# Patient Record
Sex: Male | Born: 1960
Health system: Southern US, Community
[De-identification: ages and names within clinical notes are randomized; demographics above are authoritative.]

## PROBLEM LIST (undated history)

## (undated) DIAGNOSIS — E7800 Pure hypercholesterolemia, unspecified: Secondary | ICD-10-CM

## (undated) DIAGNOSIS — F1410 Cocaine abuse, uncomplicated: Secondary | ICD-10-CM

## (undated) DIAGNOSIS — K219 Gastro-esophageal reflux disease without esophagitis: Secondary | ICD-10-CM

---

## 2018-08-22 ENCOUNTER — Emergency Department (HOSPITAL_BASED_OUTPATIENT_CLINIC_OR_DEPARTMENT_OTHER): Payer: BLUE CROSS/BLUE SHIELD

## 2018-08-22 ENCOUNTER — Encounter (HOSPITAL_BASED_OUTPATIENT_CLINIC_OR_DEPARTMENT_OTHER): Payer: Self-pay | Admitting: Emergency Medicine

## 2018-08-22 ENCOUNTER — Other Ambulatory Visit: Payer: Self-pay

## 2018-08-22 ENCOUNTER — Observation Stay (HOSPITAL_BASED_OUTPATIENT_CLINIC_OR_DEPARTMENT_OTHER)
Admission: EM | Admit: 2018-08-22 | Discharge: 2018-08-24 | Disposition: A | Discharge status: Home / Self Care | Payer: BLUE CROSS/BLUE SHIELD | Attending: Surgery | Admitting: Surgery

## 2018-08-22 DIAGNOSIS — K219 Gastro-esophageal reflux disease without esophagitis: Secondary | ICD-10-CM | POA: Insufficient documentation

## 2018-08-22 DIAGNOSIS — K8010 Calculus of gallbladder with chronic cholecystitis without obstruction: Principal | ICD-10-CM | POA: Insufficient documentation

## 2018-08-22 DIAGNOSIS — Z8379 Family history of other diseases of the digestive system: Secondary | ICD-10-CM | POA: Insufficient documentation

## 2018-08-22 DIAGNOSIS — F17210 Nicotine dependence, cigarettes, uncomplicated: Secondary | ICD-10-CM | POA: Insufficient documentation

## 2018-08-22 DIAGNOSIS — Z79899 Other long term (current) drug therapy: Secondary | ICD-10-CM | POA: Insufficient documentation

## 2018-08-22 DIAGNOSIS — K819 Cholecystitis, unspecified: Secondary | ICD-10-CM | POA: Diagnosis present

## 2018-08-22 DIAGNOSIS — E7800 Pure hypercholesterolemia, unspecified: Secondary | ICD-10-CM | POA: Insufficient documentation

## 2018-08-22 DIAGNOSIS — Z419 Encounter for procedure for purposes other than remedying health state, unspecified: Secondary | ICD-10-CM

## 2018-08-22 DIAGNOSIS — Z9049 Acquired absence of other specified parts of digestive tract: Secondary | ICD-10-CM

## 2018-08-22 DIAGNOSIS — K8000 Calculus of gallbladder with acute cholecystitis without obstruction: Secondary | ICD-10-CM | POA: Diagnosis present

## 2018-08-22 DIAGNOSIS — K810 Acute cholecystitis: Secondary | ICD-10-CM

## 2018-08-22 HISTORY — DX: Gastro-esophageal reflux disease without esophagitis: K21.9

## 2018-08-22 HISTORY — DX: Pure hypercholesterolemia, unspecified: E78.00

## 2018-08-22 LAB — COMPREHENSIVE METABOLIC PANEL
ALT: 28 U/L (ref 0–44)
ANION GAP: 9 (ref 5–15)
AST: 28 U/L (ref 15–41)
Albumin: 4.5 g/dL (ref 3.5–5.0)
Alkaline Phosphatase: 64 U/L (ref 38–126)
BUN: 9 mg/dL (ref 6–20)
CHLORIDE: 105 mmol/L (ref 98–111)
CO2: 28 mmol/L (ref 22–32)
CREATININE: 0.86 mg/dL (ref 0.61–1.24)
Calcium: 9.1 mg/dL (ref 8.9–10.3)
GLUCOSE: 119 mg/dL — AB (ref 70–99)
Potassium: 3.9 mmol/L (ref 3.5–5.1)
SODIUM: 142 mmol/L (ref 135–145)
TOTAL PROTEIN: 7.6 g/dL (ref 6.5–8.1)
Total Bilirubin: 0.5 mg/dL (ref 0.3–1.2)

## 2018-08-22 LAB — URINALYSIS, COMPLETE (UACMP) WITH MICROSCOPIC
BILIRUBIN URINE: NEGATIVE
GLUCOSE, UA: NEGATIVE mg/dL
HGB URINE DIPSTICK: NEGATIVE
Ketones, ur: NEGATIVE mg/dL
Leukocytes, UA: NEGATIVE
NITRITE: NEGATIVE
PH: 6.5 (ref 5.0–8.0)
Protein, ur: NEGATIVE mg/dL
SPECIFIC GRAVITY, URINE: 1.02 (ref 1.005–1.030)
WBC, UA: NONE SEEN WBC/hpf (ref 0–5)

## 2018-08-22 LAB — CBC WITH DIFFERENTIAL/PLATELET
BASOS PCT: 0 %
Basophils Absolute: 0 10*3/uL (ref 0.0–0.1)
EOS PCT: 5 %
Eosinophils Absolute: 0.7 10*3/uL (ref 0.0–0.7)
HCT: 46.8 % (ref 39.0–52.0)
Hemoglobin: 16.4 g/dL (ref 13.0–17.0)
LYMPHS ABS: 1.9 10*3/uL (ref 0.7–4.0)
Lymphocytes Relative: 15 %
MCH: 29.5 pg (ref 26.0–34.0)
MCHC: 35 g/dL (ref 30.0–36.0)
MCV: 84.2 fL (ref 78.0–100.0)
MONOS PCT: 9 %
Monocytes Absolute: 1.1 10*3/uL — ABNORMAL HIGH (ref 0.1–1.0)
NEUTROS PCT: 71 %
Neutro Abs: 8.9 10*3/uL — ABNORMAL HIGH (ref 1.7–7.7)
PLATELETS: 195 10*3/uL (ref 150–400)
RBC: 5.56 MIL/uL (ref 4.22–5.81)
RDW: 13.4 % (ref 11.5–15.5)
WBC: 12.5 10*3/uL — AB (ref 4.0–10.5)

## 2018-08-22 LAB — LIPASE, BLOOD: Lipase: 21 U/L (ref 11–51)

## 2018-08-22 LAB — TROPONIN I: Troponin I: <0.03 ng/mL (ref <0.03)

## 2018-08-22 MED ORDER — SODIUM CHLORIDE 0.9 % IV SOLN
INTRAVENOUS | Status: DC
  Administered 2018-08-22: 21:00:00 via INTRAVENOUS

## 2018-08-22 MED ORDER — MORPHINE SULFATE (PF) 4 MG/ML IV SOLN
4.0000 mg | Freq: Once | INTRAVENOUS | Status: AC
  Administered 2018-08-22: 4 mg via INTRAVENOUS
  Filled 2018-08-22: qty 1

## 2018-08-22 MED ORDER — ONDANSETRON HCL 4 MG/2ML IJ SOLN
4.0000 mg | Freq: Once | INTRAMUSCULAR | Status: AC
  Administered 2018-08-22: 4 mg via INTRAVENOUS
  Filled 2018-08-22: qty 2

## 2018-08-22 MED ORDER — SODIUM CHLORIDE 0.9 % IV BOLUS
1000.0000 mL | Freq: Once | INTRAVENOUS | Status: AC
  Administered 2018-08-22: 1000 mL via INTRAVENOUS

## 2018-08-22 MED ORDER — SODIUM CHLORIDE 0.9 % IV SOLN
2.0000 g | Freq: Once | INTRAVENOUS | Status: AC
  Administered 2018-08-22: 2 g via INTRAVENOUS
  Filled 2018-08-22: qty 20

## 2018-08-22 NOTE — ED Notes (Signed)
ED Provider at bedside. 

## 2018-08-22 NOTE — ED Triage Notes (Signed)
Epigastric pain since last night.  Pt has hx of heartburn but pain started last night after hot dog with chili.  Pain radiates to back.

## 2018-08-22 NOTE — ED Notes (Signed)
Pt back from US

## 2018-08-22 NOTE — ED Provider Notes (Signed)
[facility] EMERGENCY DEPARTMENT Provider Note   CSN: [id number] Arrival date & time: 08/22/18  1856     History   Chief Complaint Chief Complaint  Patient presents with  . Abdominal Pain  . Chest Pain    HPI Shawn Allen is a 57 y.o. male. Pt complains of 20 hours of pain in his upper abdomen.  It feels like a pressure in his upper abdomen that goes to his back.  It also burns.  The sx started after having a hot dog with chili.  SOme nause abut no vomiting.  No diarrhea or constpation.  No urinary trouble.  He denies chest pain or shortness of breath.  Some cough with smoking.   NO hx of heart problems.  No history of lung problems or dvt.  Pt has never been told he has history of htn.  Abdominal Pain    Chest Pain   Associated symptoms include abdominal pain.    Past Medical History:  Diagnosis Date  . GERD (gastroesophageal reflux disease)   . Hypercholesteremia     There are no active problems to display for this patient.   History reviewed. No pertinent surgical history.      Home Medications    Prior to Admission medications   Medication Sig Start Date End Date Taking? Authorizing Provider  ranitidine (ZANTAC) 150 MG capsule Take 300 mg by mouth 2 (two) times daily.     [provider]    Family History No family history on file.  Social History Social History   Tobacco Use  . Smoking status: Current Every Day Smoker  . Smokeless tobacco: Never Used  Substance Use Topics  . Alcohol use: Not on file  . Drug use: Not on file     Allergies   Patient has no known allergies.   Review of Systems Review of Systems  Cardiovascular: Positive for chest pain.  Gastrointestinal: Positive for abdominal pain.  All other systems reviewed and are negative.    Physical Exam Updated Vital Signs BP (!) 189/104 (BP Location: Left Arm)   Pulse (!) 54   Temp 98.5 F (36.9 C) (Oral)   Resp 18   Ht 1.778 m (5\' 10" )   Wt 83.9 kg    SpO2 98%   BMI 26.54 kg/m   Physical Exam  Constitutional: He appears well-developed and well-nourished. No distress.  HENT:  Head: Normocephalic and atraumatic.  Right Ear: External ear normal.  Left Ear: External ear normal.  Eyes: Conjunctivae are normal. Right eye exhibits no discharge. Left eye exhibits no discharge. No scleral icterus.  Neck: Neck supple. No tracheal deviation present.  Cardiovascular: Normal rate, regular rhythm and intact distal pulses.  Pulmonary/Chest: Effort normal and breath sounds normal. No stridor. No respiratory distress. He has no wheezes. He has no rales.  Abdominal: Soft. Bowel sounds are normal. He exhibits no distension. There is tenderness ( mild) in the epigastric area. There is no rebound and no guarding.  Musculoskeletal: He exhibits no edema or tenderness.  Neurological: He is alert. He has normal strength. No cranial nerve deficit (no facial droop, extraocular movements intact, no slurred speech) or sensory deficit. He exhibits normal muscle tone. He displays no seizure activity. Coordination normal.  Skin: Skin is warm and dry. No rash noted.  Psychiatric: He has a normal mood and affect.  Nursing note and vitals reviewed.    ED Treatments / Results  Labs (all labs ordered are listed, but only abnormal results  are displayed) Labs Reviewed  COMPREHENSIVE METABOLIC PANEL - Abnormal; Notable for the following components:      Result Value   Glucose, Bld 119 (*)    All other components within normal limits  CBC WITH DIFFERENTIAL/PLATELET - Abnormal; Notable for the following components:   WBC 12.5 (*)    Neutro Abs 8.9 (*)    Monocytes Absolute 1.1 (*)    All other components within normal limits  URINALYSIS, COMPLETE (UACMP) WITH MICROSCOPIC - Abnormal; Notable for the following components:   Bacteria, UA RARE (*)    All other components within normal limits  LIPASE, BLOOD  TROPONIN I    EKG EKG  Interpretation  Date/Time:  Tuesday August 22 2018 19:06:42 EDT Ventricular Rate:  56 PR Interval:  154 QRS Duration: 88 QT Interval:  402 QTC Calculation: 387 R Axis:   38 Text Interpretation:  Sinus bradycardia Otherwise normal ECG No old tracing to compare Confirmed by Linwood DibblesKnapp, Sareen Randon [phone]([id number]) on 08/22/2018 7:12:45 PM   Radiology Dg Chest 2 View  Result Date: 08/22/2018 CLINICAL DATA:  Lower chest pain last night and today. Nausea. Current smoker. EXAM: CHEST - 2 VIEW COMPARISON:  None. FINDINGS: The heart size and mediastinal contours are within normal limits. Both lungs are clear. The visualized skeletal structures are unremarkable. IMPRESSION: No active cardiopulmonary disease. Electronically Signed   By: Burman NievesWilliam  Stevens M.D.   On: 08/22/2018 21:03   [place] Abdomen Complete  Result Date: 08/22/2018 CLINICAL DATA:  Initial evaluation for acute epigastric and chest pain. EXAM: ABDOMEN ULTRASOUND COMPLETE COMPARISON:  None. FINDINGS: Gallbladder: Shadowing echogenic stones present within the gallbladder lumen, largest of which measures 1.6 cm. Associated sludge. Gallbladder wall mildly thickened up to 5 mm. No free pericholecystic fluid. Evaluation for sonographic Eulah PontMurphy sign limited as the patient was medicated. Note made of an 8 mm echogenic lesion seen adherent to the nondependent portion of the gallbladder wall (image 13). Common bile duct: Diameter: 6 mm Liver: No focal lesion identified. Diffusely increased echogenicity, suggesting steatosis. Portal vein is patent on color Doppler imaging with normal direction of blood flow towards the liver. IVC: No abnormality visualized. Pancreas: Visualized portion unremarkable. Spleen: Size and appearance within normal limits. Right Kidney: Length: 10.5 cm. Echogenicity within normal limits. No hydronephrosis. 3 mm nonobstructive stone noted. 1.5 cm simple cyst noted. Left Kidney: Length: 10.3 cm. Echogenicity within normal limits. No hydronephrosis. 4 mm  nonobstructive stone noted. Few small simple cysts measuring up to 1.1 cm noted. Abdominal aorta: Atherosclerotic change noted within the intra-abdominal aorta. The aorta itself is ectatic measuring up to 2.8 cm in diameter. Other findings: None. IMPRESSION: 1. Cholelithiasis with associated sludge and mild gallbladder wall thickening. Clinical correlation for possible acute cholecystitis recommended. 2. No biliary dilatation. 3. Hepatic steatosis. 4. Bilateral nonobstructive nephrolithiasis as above. No hydronephrosis. 5. Bilateral simple renal cysts measuring up to 1.5 cm as above. 6. 8 mm echogenic lesion arising from the gallbladder wall. Finding is indeterminate, with differential considerations including a small polyp versus adenoma. Possible malignancy not entirely excluded. A follow-up ultrasound in 1 year to document stability is recommended. 7. Ectatic abdominal aorta measuring up to 2.8 cm in diameter, at risk for aneurysm development. Recommend followup by ultrasound in 5 years. This recommendation follows ACR consensus guidelines: White Paper of the ACR Incidental Findings Committee II on Vascular Findings. J Am Coll Radiol 2013; 10:789-794. Electronically Signed   By: Rise MuBenjamin  McClintock M.D.   On: 08/22/2018 23:04    Procedures Procedures (  including critical care time)  Medications Ordered in ED Medications  sodium chloride 0.9 % bolus 1,000 mL ( Intravenous Stopped 08/22/18 2048)    And  0.9 %  sodium chloride infusion ( Intravenous Rate/Dose Verify 08/22/18 2051)  cefTRIAXone (ROCEPHIN) 2 g in sodium chloride 0.9 % 100 mL IVPB (has no administration in time range)  ondansetron (ZOFRAN) injection 4 mg (4 mg Intravenous Given 08/22/18 2037)  morphine 4 MG/ML injection 4 mg (4 mg Intravenous Given 08/22/18 2038)  morphine 4 MG/ML injection 4 mg (4 mg Intravenous Given 08/22/18 2128)     Initial Impression / Assessment and Plan / ED Course  I have reviewed the triage vital signs and the  nursing notes.  Pertinent labs & imaging results that were available during my care of the patient were reviewed by me and considered in my medical decision making (see chart for details).   Patient presented to the emergency room for evaluation of abdominal pain.  On exam the patient had tenderness palpation of his epigastrium and right upper quadrant.  Laboratory tests showed an elevated white blood cell count.  Ultrasound demonstrates cholelithiasis with some findings suggestive of acute cholecystitis.  Patient does continue to have tenderness in his upper abdomen.  Considering his elevated white blood cell count and the double cholecystitis noted on ultrasound I will consult with surgery and arrange for transfer and admission.  Final Clinical Impressions(s) / ED Diagnoses   Final diagnoses:  Cholecystitis, acute      Linwood Dibbles, MD 08/22/18 2323

## 2018-08-23 ENCOUNTER — Encounter (HOSPITAL_COMMUNITY)
Admission: EM | Disposition: A | Discharge status: Home / Self Care | Payer: Self-pay | Source: Home / Self Care | Attending: Emergency Medicine

## 2018-08-23 ENCOUNTER — Observation Stay (HOSPITAL_COMMUNITY): Payer: BLUE CROSS/BLUE SHIELD

## 2018-08-23 ENCOUNTER — Observation Stay (HOSPITAL_COMMUNITY): Payer: BLUE CROSS/BLUE SHIELD | Admitting: Certified Registered Nurse Anesthetist

## 2018-08-23 DIAGNOSIS — F17210 Nicotine dependence, cigarettes, uncomplicated: Secondary | ICD-10-CM | POA: Diagnosis not present

## 2018-08-23 DIAGNOSIS — K219 Gastro-esophageal reflux disease without esophagitis: Secondary | ICD-10-CM | POA: Diagnosis not present

## 2018-08-23 DIAGNOSIS — E7800 Pure hypercholesterolemia, unspecified: Secondary | ICD-10-CM | POA: Diagnosis not present

## 2018-08-23 DIAGNOSIS — Z8379 Family history of other diseases of the digestive system: Secondary | ICD-10-CM | POA: Diagnosis not present

## 2018-08-23 DIAGNOSIS — K8000 Calculus of gallbladder with acute cholecystitis without obstruction: Secondary | ICD-10-CM | POA: Diagnosis present

## 2018-08-23 DIAGNOSIS — K8010 Calculus of gallbladder with chronic cholecystitis without obstruction: Secondary | ICD-10-CM | POA: Diagnosis not present

## 2018-08-23 DIAGNOSIS — Z79899 Other long term (current) drug therapy: Secondary | ICD-10-CM | POA: Diagnosis not present

## 2018-08-23 DIAGNOSIS — Z9049 Acquired absence of other specified parts of digestive tract: Secondary | ICD-10-CM

## 2018-08-23 HISTORY — PX: CHOLECYSTECTOMY: SHX55

## 2018-08-23 LAB — CREATININE, SERUM
CREATININE: 0.94 mg/dL (ref 0.61–1.24)
GFR calc Af Amer: >60 mL/min (ref >60)
GFR calc non Af Amer: >60 mL/min (ref >60)

## 2018-08-23 LAB — CBC
HCT: 41.8 % (ref 39.0–52.0)
HEMOGLOBIN: 14.1 g/dL (ref 13.0–17.0)
MCH: 29.4 pg (ref 26.0–34.0)
MCHC: 33.7 g/dL (ref 30.0–36.0)
MCV: 87.3 fL (ref 78.0–100.0)
Platelets: 163 10*3/uL (ref 150–400)
RBC: 4.79 MIL/uL (ref 4.22–5.81)
RDW: 13.4 % (ref 11.5–15.5)
WBC: 10.1 10*3/uL (ref 4.0–10.5)

## 2018-08-23 LAB — MRSA PCR SCREENING: MRSA by PCR: NEGATIVE

## 2018-08-23 LAB — HIV ANTIBODY (ROUTINE TESTING W REFLEX): HIV Screen 4th Generation wRfx: NONREACTIVE

## 2018-08-23 SURGERY — LAPAROSCOPIC CHOLECYSTECTOMY WITH INTRAOPERATIVE CHOLANGIOGRAM
Anesthesia: General

## 2018-08-23 MED ORDER — HYDRALAZINE HCL 20 MG/ML IJ SOLN
10.0000 mg | Freq: Once | INTRAMUSCULAR | Status: AC
  Administered 2018-08-23: 10 mg via INTRAVENOUS

## 2018-08-23 MED ORDER — DIPHENHYDRAMINE HCL 12.5 MG/5ML PO ELIX: 12.5000 mg | ORAL_SOLUTION | Freq: Four times a day (QID) | ORAL | Status: DC | PRN

## 2018-08-23 MED ORDER — SIMETHICONE 80 MG PO CHEW: 40.0000 mg | CHEWABLE_TABLET | Freq: Four times a day (QID) | ORAL | Status: DC | PRN

## 2018-08-23 MED ORDER — FAMOTIDINE 20 MG PO TABS: 20.0000 mg | ORAL_TABLET | Freq: Every day | ORAL | Status: DC

## 2018-08-23 MED ORDER — FENTANYL CITRATE (PF) 100 MCG/2ML IJ SOLN
50.0000 ug | Freq: Once | INTRAMUSCULAR | Status: AC
  Administered 2018-08-23: 50 ug via INTRAVENOUS

## 2018-08-23 MED ORDER — ACETAMINOPHEN 650 MG RE SUPP: 650.0000 mg | Freq: Four times a day (QID) | RECTAL | Status: DC | PRN

## 2018-08-23 MED ORDER — ONDANSETRON 4 MG PO TBDP: 4.0000 mg | ORAL_TABLET | Freq: Four times a day (QID) | ORAL | Status: DC | PRN

## 2018-08-23 MED ORDER — MORPHINE SULFATE (PF) 2 MG/ML IV SOLN
1.0000 mg | INTRAVENOUS | Status: DC | PRN
  Administered 2018-08-23 (×2): 1 mg via INTRAVENOUS
  Filled 2018-08-23 (×2): qty 1

## 2018-08-23 MED ORDER — ENOXAPARIN SODIUM 40 MG/0.4ML ~~LOC~~ SOLN: 40.0000 mg | SUBCUTANEOUS | Status: DC

## 2018-08-23 MED ORDER — DIPHENHYDRAMINE HCL 50 MG/ML IJ SOLN: 12.5000 mg | Freq: Four times a day (QID) | INTRAMUSCULAR | Status: DC | PRN

## 2018-08-23 MED ORDER — HYDROMORPHONE HCL 1 MG/ML IJ SOLN
0.5000 mg | INTRAMUSCULAR | Status: DC | PRN
  Administered 2018-08-23 (×2): 1 mg via INTRAVENOUS
  Administered 2018-08-23: 2 mg via INTRAVENOUS
  Filled 2018-08-23 (×2): qty 1
  Filled 2018-08-23: qty 2

## 2018-08-23 MED ORDER — HYDRALAZINE HCL 20 MG/ML IJ SOLN: 10.0000 mg | INTRAMUSCULAR | Status: DC | PRN

## 2018-08-23 MED ORDER — KCL IN DEXTROSE-NACL 20-5-0.45 MEQ/L-%-% IV SOLN
INTRAVENOUS | Status: DC
  Administered 2018-08-23: 02:00:00 via INTRAVENOUS
  Filled 2018-08-23 (×2): qty 1000

## 2018-08-23 MED ORDER — ONDANSETRON HCL 4 MG/2ML IJ SOLN: 4.0000 mg | Freq: Four times a day (QID) | INTRAMUSCULAR | Status: DC | PRN

## 2018-08-23 MED ORDER — OXYCODONE HCL 5 MG PO TABS: 5.0000 mg | ORAL_TABLET | ORAL | Status: DC | PRN

## 2018-08-23 MED ORDER — IOPAMIDOL (ISOVUE-300) INJECTION 61%
INTRAVENOUS | Status: DC | PRN
  Administered 2018-08-23: 3 mL

## 2018-08-23 MED ORDER — LIDOCAINE 2% (20 MG/ML) 5 ML SYRINGE
INTRAMUSCULAR | Status: AC
  Filled 2018-08-23: qty 5

## 2018-08-23 MED ORDER — MIDAZOLAM HCL 5 MG/5ML IJ SOLN
INTRAMUSCULAR | Status: DC | PRN
  Administered 2018-08-23: 2 mg via INTRAVENOUS

## 2018-08-23 MED ORDER — ONDANSETRON HCL 4 MG/2ML IJ SOLN
INTRAMUSCULAR | Status: DC | PRN
  Administered 2018-08-23: 4 mg via INTRAVENOUS

## 2018-08-23 MED ORDER — SUCCINYLCHOLINE CHLORIDE 200 MG/10ML IV SOSY
PREFILLED_SYRINGE | INTRAVENOUS | Status: AC
  Filled 2018-08-23: qty 10

## 2018-08-23 MED ORDER — DEXAMETHASONE SODIUM PHOSPHATE 10 MG/ML IJ SOLN
INTRAMUSCULAR | Status: DC | PRN
  Administered 2018-08-23: 10 mg via INTRAVENOUS

## 2018-08-23 MED ORDER — HYDROMORPHONE HCL 2 MG/ML IJ SOLN
INTRAMUSCULAR | Status: AC
  Filled 2018-08-23: qty 1

## 2018-08-23 MED ORDER — ROCURONIUM BROMIDE 10 MG/ML (PF) SYRINGE
PREFILLED_SYRINGE | INTRAVENOUS | Status: AC
  Filled 2018-08-23: qty 10

## 2018-08-23 MED ORDER — MIDAZOLAM HCL 2 MG/2ML IJ SOLN
INTRAMUSCULAR | Status: AC
  Filled 2018-08-23: qty 2

## 2018-08-23 MED ORDER — LACTATED RINGERS IR SOLN
Status: DC | PRN
  Administered 2018-08-23: 1000 mL

## 2018-08-23 MED ORDER — FENTANYL CITRATE (PF) 100 MCG/2ML IJ SOLN
INTRAMUSCULAR | Status: AC
  Filled 2018-08-23: qty 2

## 2018-08-23 MED ORDER — TRAMADOL HCL 50 MG PO TABS: 50.0000 mg | ORAL_TABLET | Freq: Four times a day (QID) | ORAL | Status: DC | PRN

## 2018-08-23 MED ORDER — SODIUM CHLORIDE 0.9 % IV SOLN
2.0000 g | INTRAVENOUS | Status: DC
  Filled 2018-08-23: qty 20

## 2018-08-23 MED ORDER — ACETAMINOPHEN 325 MG PO TABS: 650.0000 mg | ORAL_TABLET | Freq: Four times a day (QID) | ORAL | Status: DC | PRN

## 2018-08-23 MED ORDER — HYDROMORPHONE HCL 1 MG/ML IJ SOLN
INTRAMUSCULAR | Status: DC | PRN
  Administered 2018-08-23 (×5): .4 mg via INTRAVENOUS

## 2018-08-23 MED ORDER — FAMOTIDINE IN NACL 20-0.9 MG/50ML-% IV SOLN
20.0000 mg | Freq: Two times a day (BID) | INTRAVENOUS | Status: DC
  Administered 2018-08-23 – 2018-08-24 (×2): 20 mg via INTRAVENOUS
  Filled 2018-08-23 (×2): qty 50

## 2018-08-23 MED ORDER — 0.9 % SODIUM CHLORIDE (POUR BTL) OPTIME
TOPICAL | Status: DC | PRN
  Administered 2018-08-23: 1000 mL

## 2018-08-23 MED ORDER — PROPOFOL 10 MG/ML IV BOLUS
INTRAVENOUS | Status: DC | PRN
  Administered 2018-08-23: 170 mg via INTRAVENOUS

## 2018-08-23 MED ORDER — HYDROMORPHONE HCL 1 MG/ML IJ SOLN: 0.2500 mg | INTRAMUSCULAR | Status: DC | PRN

## 2018-08-23 MED ORDER — IOPAMIDOL (ISOVUE-300) INJECTION 61%
INTRAVENOUS | Status: AC
  Filled 2018-08-23: qty 50

## 2018-08-23 MED ORDER — DEXAMETHASONE SODIUM PHOSPHATE 10 MG/ML IJ SOLN
INTRAMUSCULAR | Status: AC
  Filled 2018-08-23: qty 1

## 2018-08-23 MED ORDER — KCL IN DEXTROSE-NACL 20-5-0.45 MEQ/L-%-% IV SOLN
INTRAVENOUS | Status: DC
  Administered 2018-08-23 – 2018-08-24 (×2): via INTRAVENOUS
  Filled 2018-08-23: qty 1000

## 2018-08-23 MED ORDER — SUGAMMADEX SODIUM 200 MG/2ML IV SOLN
INTRAVENOUS | Status: DC | PRN
  Administered 2018-08-23: 200 mg via INTRAVENOUS

## 2018-08-23 MED ORDER — FENTANYL CITRATE (PF) 100 MCG/2ML IJ SOLN
INTRAMUSCULAR | Status: DC | PRN
  Administered 2018-08-23 (×6): 50 ug via INTRAVENOUS

## 2018-08-23 MED ORDER — SODIUM CHLORIDE 0.9 % IV SOLN
2.0000 g | INTRAVENOUS | Status: DC
  Administered 2018-08-24: 2 g via INTRAVENOUS
  Filled 2018-08-23: qty 2

## 2018-08-23 MED ORDER — GABAPENTIN 300 MG PO CAPS
300.0000 mg | ORAL_CAPSULE | Freq: Two times a day (BID) | ORAL | Status: DC
  Administered 2018-08-23 – 2018-08-24 (×2): 300 mg via ORAL
  Filled 2018-08-23 (×2): qty 1

## 2018-08-23 MED ORDER — LACTATED RINGERS IV SOLN
INTRAVENOUS | Status: DC
  Administered 2018-08-23 (×2): via INTRAVENOUS

## 2018-08-23 MED ORDER — SUCCINYLCHOLINE CHLORIDE 200 MG/10ML IV SOSY
PREFILLED_SYRINGE | INTRAVENOUS | Status: DC | PRN
  Administered 2018-08-23: 100 mg via INTRAVENOUS

## 2018-08-23 MED ORDER — MIDAZOLAM HCL 2 MG/2ML IJ SOLN
1.0000 mg | Freq: Once | INTRAMUSCULAR | Status: AC
  Administered 2018-08-23: 1 mg via INTRAVENOUS

## 2018-08-23 MED ORDER — PROPOFOL 10 MG/ML IV BOLUS
INTRAVENOUS | Status: AC
  Filled 2018-08-23: qty 20

## 2018-08-23 MED ORDER — METHOCARBAMOL 500 MG PO TABS: 500.0000 mg | ORAL_TABLET | Freq: Four times a day (QID) | ORAL | Status: DC | PRN

## 2018-08-23 MED ORDER — HEPARIN SODIUM (PORCINE) 5000 UNIT/ML IJ SOLN
5000.0000 [IU] | Freq: Three times a day (TID) | INTRAMUSCULAR | Status: DC
  Administered 2018-08-24: 5000 [IU] via SUBCUTANEOUS
  Filled 2018-08-23: qty 1

## 2018-08-23 MED ORDER — HYDRALAZINE HCL 20 MG/ML IJ SOLN
INTRAMUSCULAR | Status: AC
  Administered 2018-08-23: 10 mg via INTRAVENOUS
  Filled 2018-08-23: qty 1

## 2018-08-23 MED ORDER — ROCURONIUM BROMIDE 50 MG/5ML IV SOSY
PREFILLED_SYRINGE | INTRAVENOUS | Status: DC | PRN
  Administered 2018-08-23: 50 mg via INTRAVENOUS
  Administered 2018-08-23: 10 mg via INTRAVENOUS
  Administered 2018-08-23: 20 mg via INTRAVENOUS

## 2018-08-23 MED ORDER — HYDROCODONE-ACETAMINOPHEN 5-325 MG PO TABS
1.0000 | ORAL_TABLET | ORAL | Status: DC | PRN
  Administered 2018-08-24: 1 via ORAL
  Filled 2018-08-23: qty 1

## 2018-08-23 MED ORDER — LIDOCAINE 2% (20 MG/ML) 5 ML SYRINGE
INTRAMUSCULAR | Status: DC | PRN
  Administered 2018-08-23: 80 mg via INTRAVENOUS

## 2018-08-23 MED ORDER — BUPIVACAINE LIPOSOME 1.3 % IJ SUSP
20.0000 mL | Freq: Once | INTRAMUSCULAR | Status: AC
  Administered 2018-08-23: 20 mL
  Filled 2018-08-23: qty 20

## 2018-08-23 SURGICAL SUPPLY — 43 items
APPLICATOR COTTON TIP 6 STRL (MISCELLANEOUS) ×1 IMPLANT
APPLICATOR COTTON TIP 6IN STRL (MISCELLANEOUS) ×3
APPLIER CLIP ROT 10 11.4 M/L (STAPLE) ×3
BENZOIN TINCTURE PRP APPL 2/3 (GAUZE/BANDAGES/DRESSINGS) IMPLANT
CABLE HIGH FREQUENCY MONO STRZ (ELECTRODE) ×3 IMPLANT
CATH REDDICK CHOLANGI 4FR 50CM (CATHETERS) ×3 IMPLANT
CLIP APPLIE ROT 10 11.4 M/L (STAPLE) ×1 IMPLANT
CLOSURE WOUND 1/2 X4 (GAUZE/BANDAGES/DRESSINGS)
COVER MAYO STAND STRL (DRAPES) ×3 IMPLANT
COVER SURGICAL LIGHT HANDLE (MISCELLANEOUS) ×3 IMPLANT
DERMABOND ADVANCED (GAUZE/BANDAGES/DRESSINGS) ×2
DERMABOND ADVANCED .7 DNX12 (GAUZE/BANDAGES/DRESSINGS) ×1 IMPLANT
DRAPE C-ARM 42X120 X-RAY (DRAPES) ×3 IMPLANT
ELECT L-HOOK LAP 45CM DISP (ELECTROSURGICAL) ×3
ELECT PENCIL ROCKER SW 15FT (MISCELLANEOUS) IMPLANT
ELECT REM PT RETURN 15FT ADLT (MISCELLANEOUS) ×3 IMPLANT
ELECTRODE L-HOOK LAP 45CM DISP (ELECTROSURGICAL) ×1 IMPLANT
GLOVE BIOGEL M 8.0 STRL (GLOVE) ×3 IMPLANT
GLOVE BIOGEL PI IND STRL 6 (GLOVE) ×1 IMPLANT
GLOVE BIOGEL PI IND STRL 7.0 (GLOVE) ×2 IMPLANT
GLOVE BIOGEL PI INDICATOR 6 (GLOVE) ×2
GLOVE BIOGEL PI INDICATOR 7.0 (GLOVE) ×4
GLOVE SURG SS PI 6.0 STRL IVOR (GLOVE) ×3 IMPLANT
GLOVE SURG SS PI 7.0 STRL IVOR (GLOVE) ×6 IMPLANT
GOWN STRL REUS W/TWL XL LVL3 (GOWN DISPOSABLE) ×12 IMPLANT
HEMOSTAT SURGICEL 4X8 (HEMOSTASIS) IMPLANT
IV CATH 14GX2 1/4 (CATHETERS) ×3 IMPLANT
KIT BASIN OR (CUSTOM PROCEDURE TRAY) ×3 IMPLANT
POUCH RETRIEVAL ECOSAC 10 (ENDOMECHANICALS) ×1 IMPLANT
POUCH RETRIEVAL ECOSAC 10MM (ENDOMECHANICALS) ×2
SCISSORS LAP 5X45 EPIX DISP (ENDOMECHANICALS) ×3 IMPLANT
SET IRRIG TUBING LAPAROSCOPIC (IRRIGATION / IRRIGATOR) ×3 IMPLANT
SLEEVE XCEL OPT CAN 5 100 (ENDOMECHANICALS) ×3 IMPLANT
STAPLER VISISTAT 35W (STAPLE) ×3 IMPLANT
STRIP CLOSURE SKIN 1/2X4 (GAUZE/BANDAGES/DRESSINGS) IMPLANT
SUT MNCRL AB 4-0 PS2 18 (SUTURE) ×3 IMPLANT
SYR 20CC LL (SYRINGE) ×3 IMPLANT
TOWEL OR 17X26 10 PK STRL BLUE (TOWEL DISPOSABLE) ×3 IMPLANT
TRAY LAPAROSCOPIC (CUSTOM PROCEDURE TRAY) ×3 IMPLANT
TROCAR BLADELESS OPT 5 100 (ENDOMECHANICALS) ×3 IMPLANT
TROCAR XCEL BLUNT TIP 100MML (ENDOMECHANICALS) IMPLANT
TROCAR XCEL NON-BLD 11X100MML (ENDOMECHANICALS) ×3 IMPLANT
TUBING INSUF HEATED (TUBING) ×3 IMPLANT

## 2018-08-23 NOTE — Op Note (Signed)
Shawn KeelsJeffrey Allen  Primary Care Physician:  Patient, No Pcp Per    08/23/2018  5:02 PM  Procedure: Laparoscopic Cholecystectomy with intraoperative cholangiogram  Surgeon: Susy FrizzleMatt B. Daphine DeutscherMartin, MD, FACS Asst:  none  Anes:  General  Drains:  None  Findings: Acute cholecystitis with normal IOC  Description of Procedure: The patient was taken to OR 1 and given general anesthesia.  The patient was prepped with PCMX and draped sterilely. A time out was performed.  Access to the abdomen was achieved with a 5 mm Optiview through the right upper quadrant.  Port placement included three 5 mm ports and one 11 mm port in the midline.    The gallbladder was visualized and the fundus was grasped and the gallbladder was elevated. Traction on the infundibulum allowed for successful demonstration of the critical view. Inflammatory changes were acute and the gallbladder was decompressed with the suction needle.  Infundibulum was taken down from the adhesions from the duodenum.  The cystic duct was identified and clipped up on the gallbladder and an incision was made in the cystic duct and the Reddick catheter was inserted after milking the cystic duct of any debris. A dynamic cholangiogram was performed which demonstrated showed intrahepatic filling and flow into the duodenum.    The cystic duct was then triple clipped and divided, the cystic artery was double clipped and divided and then the gallbladder was removed from the gallbladder bed. Removal of the gallbladder from the gallbladder bed was performed without spillage.  The gallbladder was then placed in a bag and brought out through one of the trocar sites. The gallbladder bed was inspected and no bleeding or bile leaks were seen.   Laparoscopic visualization was used when closing fascial defect in the upper abdomen with a PRI.   Incisions were injected with Exparel and closed with 4-0 Monocryl and Dermabond on the skin.  Sponge and needle count were correct.  .     The patient was taken to the recovery room in satisfactory condition.

## 2018-08-23 NOTE — Anesthesia Postprocedure Evaluation (Signed)
Anesthesia Post Note  Patient: Shawn KeelsJeffrey Allen  Procedure(s) Performed: LAPAROSCOPIC CHOLECYSTECTOMY WITH INTRAOPERATIVE CHOLANGIOGRAM (N/A )     Patient location during evaluation: PACU Anesthesia Type: General Level of consciousness: awake Pain management: pain level controlled Vital Signs Assessment: post-procedure vital signs reviewed and stable Respiratory status: spontaneous breathing Cardiovascular status: stable Postop Assessment: no apparent nausea or vomiting Anesthetic complications: no    Last Vitals:  Vitals:   08/23/18 1718 08/23/18 1730  BP: (!) 182/103 (!) 180/101  Pulse: (!) 104 79  Resp: 12 20  Temp: 37.1 C   SpO2: 100% 100%    Last Pain:  Vitals:   08/23/18 1718  TempSrc:   PainSc: Asleep                 Olusegun Gerstenberger

## 2018-08-23 NOTE — Anesthesia Preprocedure Evaluation (Addendum)
Anesthesia Evaluation  Patient identified by MRN, date of birth, ID band Patient awake    Reviewed: Allergy & Precautions, NPO status   Airway Mallampati: II  TM Distance: >3 FB     Dental   Pulmonary Current Smoker,    breath sounds clear to auscultation       Cardiovascular negative cardio ROS   Rhythm:Regular Rate:Normal     Neuro/Psych    GI/Hepatic Neg liver ROS, GERD  ,  Endo/Other  negative endocrine ROS  Renal/GU negative Renal ROS     Musculoskeletal   Abdominal   Peds  Hematology   Anesthesia Other Findings   Reproductive/Obstetrics                             Anesthesia Physical Anesthesia Plan  ASA: III  Anesthesia Plan: General   Post-op Pain Management:    Induction: Intravenous  PONV Risk Score and Plan: Dexamethasone, Ondansetron and Midazolam  Airway Management Planned: Oral ETT  Additional Equipment:   Intra-op Plan:   Post-operative Plan: Extubation in OR  Informed Consent:   Dental advisory given  Plan Discussed with: Anesthesiologist and CRNA  Anesthesia Plan Comments:        Anesthesia Quick Evaluation

## 2018-08-23 NOTE — Discharge Instructions (Signed)

## 2018-08-23 NOTE — Anesthesia Procedure Notes (Signed)
Procedure Name: Intubation Date/Time: 08/23/2018 3:23 PM Performed by: West Pugh, CRNA Pre-anesthesia Checklist: Patient identified, Emergency Drugs available, Suction available, Patient being monitored and Timeout performed Patient Re-evaluated:Patient Re-evaluated prior to induction Oxygen Delivery Method: Circle system utilized Preoxygenation: Pre-oxygenation with 100% oxygen Induction Type: IV induction Laryngoscope Size: Mac and 4 Grade View: Grade II Tube type: Oral Tube size: 7.5 mm Number of attempts: 1 Airway Equipment and Method: Stylet Placement Confirmation: ETT inserted through vocal cords under direct vision,  positive ETCO2,  CO2 detector and breath sounds checked- equal and bilateral Secured at: 22 cm Tube secured with: Tape Dental Injury: Teeth and Oropharynx as per pre-operative assessment

## 2018-08-23 NOTE — H&P (Addendum)
[facility] Admission Note  Shawn Allen 06-28-1961  [id number].    Requesting MD: Tomi Bamberger, MD Chief Complaint/Reason for Consult: cholecystitis   HPI:  Shawn Allen is a 57 year old male with a history of GERD and high cholesterol who presented to [facility] on 08/22/2018 with a chief complaint of acute upper abdominal pain.  Patient states the pain started less than 24 hours ago after eating a chili dog.  Described as sharp and burning epigastric pain with radiation through to his back.  Patient reports a history similar pain in the past, multiple times after meals, but the pain is usually less severe and goes away.  Denies alleviating factors.  Associated symptoms include nausea. Reports a family history of gallbladder disease, stating his mom and sister have had their GB removed. patient is a current smoker who smokes 1-1.5 ppd.  Denies illicit drug use.  Denies a history of diabetes, hypertension, heart attack, or stroke.  Patient reports nausea/vomiting when he eats shellfish, denies any other known allergies.  Denies a history of abdominal surgeries.  Work-up in the emergency department revealed a leukocytosis of 12,500 and right upper quadrant ultrasound revealed a gallbladder with wall thickening and cholelithiasis concerning for possible acute cholecystitis.  LFTs and lipase were within normal limits.  Troponin and UA were normal.  [facility] surgery at [facility] was asked to accept the patient in transfer for management of acute cholecystitis.  ROS: Review of Systems  Gastrointestinal: Positive for abdominal pain, heartburn and nausea.  All other systems reviewed and are negative.   No family history on file.  Past Medical History:  Diagnosis Date  . GERD (gastroesophageal reflux disease)   . Hypercholesteremia     History reviewed. No pertinent surgical history.  Social History:  reports that he has been smoking. He has never used smokeless tobacco.  His alcohol and drug histories are not on file.  Allergies:  Allergies  Allergen Reactions  . Shellfish Allergy Other (See Comments)    Per patient, gets food poisoning symptoms     Medications Prior to Admission  Medication Sig Dispense Refill  . atorvastatin (LIPITOR) 20 MG tablet Take 20 mg by mouth daily.    . pantoprazole (PROTONIX) 40 MG tablet Take 40 mg by mouth 2 (two) times daily.    . ranitidine (ZANTAC) 150 MG capsule Take 300 mg by mouth 2 (two) times daily.       Blood pressure (!) 148/83, pulse (!) 46, temperature (!) 97.2 F (36.2 C), temperature source Oral, resp. rate 18, height '5\' 10"'$  (1.778 m), weight 83.9 kg, SpO2 100 %. Physical Exam: Physical Exam  Constitutional: He is oriented to person, place, and time. He appears well-developed and well-nourished.  Non-toxic appearance. He does not appear ill. No distress.  HENT:  Head: Normocephalic and atraumatic.  Cardiovascular: Normal rate, regular rhythm and intact distal pulses.  Pulmonary/Chest: Effort normal and breath sounds normal. No stridor. No respiratory distress. He has no rhonchi. He has no rales.  Abdominal: Normal appearance and bowel sounds are normal. He exhibits distension. There is tenderness in the right upper quadrant and epigastric area. There is no rebound, no guarding and negative Murphy's sign. No hernia.  Neurological: He is alert and oriented to person, place, and time.  Skin: Skin is warm and dry. Capillary refill takes less than 2 seconds. No rash noted.  Psychiatric: He has a normal mood and affect. His behavior is normal.    Results for orders  placed or performed during the hospital encounter of 08/22/18 (from the past 48 hour(s))  Comprehensive metabolic panel     Status: Abnormal   Collection Time: 08/22/18  8:13 PM  Result Value Ref Range   Sodium 142 135 - 145 mmol/L   Potassium 3.9 3.5 - 5.1 mmol/L   Chloride 105 98 - 111 mmol/L   CO2 28 22 - 32 mmol/L   Glucose, Bld 119 (H) 70  - 99 mg/dL   BUN 9 6 - 20 mg/dL   Creatinine, Ser 0.86 0.61 - 1.24 mg/dL   Calcium 9.1 8.9 - 10.3 mg/dL   Total Protein 7.6 6.5 - 8.1 g/dL   Albumin 4.5 3.5 - 5.0 g/dL   AST 28 15 - 41 U/L   ALT 28 0 - 44 U/L   Alkaline Phosphatase 64 38 - 126 U/L   Total Bilirubin 0.5 0.3 - 1.2 mg/dL   GFR calc non Af Amer >60 >60 mL/min   GFR calc Af Amer >60 >60 mL/min    Comment: (NOTE) The eGFR has been calculated using the CKD EPI equation. This calculation has not been validated in all clinical situations. eGFR's persistently <60 mL/min signify possible Chronic Kidney Disease.    Anion gap 9 5 - 15    Comment: Performed at [facility], [address] [place], [place] [address]  Lipase, blood     Status: None   Collection Time: 08/22/18  8:13 PM  Result Value Ref Range   Lipase 21 11 - 51 U/L    Comment: Performed at [facility], [address] [place], [place] [address]  CBC with Diff     Status: Abnormal   Collection Time: 08/22/18  8:13 PM  Result Value Ref Range   WBC 12.5 (H) 4.0 - 10.5 K/uL   RBC 5.56 4.22 - 5.81 MIL/uL   Hemoglobin 16.4 13.0 - 17.0 g/dL   HCT 46.8 39.0 - 52.0 %   MCV 84.2 78.0 - 100.0 fL   MCH 29.5 26.0 - 34.0 pg   MCHC 35.0 30.0 - 36.0 g/dL   RDW 13.4 11.5 - 15.5 %   Platelets 195 150 - 400 K/uL   Neutrophils Relative % 71 %   Neutro Abs 8.9 (H) 1.7 - 7.7 K/uL   Lymphocytes Relative 15 %   Lymphs Abs 1.9 0.7 - 4.0 K/uL   Monocytes Relative 9 %   Monocytes Absolute 1.1 (H) 0.1 - 1.0 K/uL   Eosinophils Relative 5 %   Eosinophils Absolute 0.7 0.0 - 0.7 K/uL   Basophils Relative 0 %   Basophils Absolute 0.0 0.0 - 0.1 K/uL    Comment: Performed at [facility], [address] [place], [place] [address]  Troponin I     Status: None   Collection Time: 08/22/18  8:13 PM  Result Value Ref Range   Troponin I <0.03 <0.03 ng/mL    Comment: Performed at [facility], [address] [place], [place] [address]   Urinalysis, Complete w Microscopic     Status: Abnormal   Collection Time: 08/22/18  9:00 PM  Result Value Ref Range   Color, Urine YELLOW YELLOW   APPearance CLEAR CLEAR   Specific Gravity, Urine 1.020 1.005 - 1.030   pH 6.5 5.0 - 8.0   Glucose, UA NEGATIVE NEGATIVE mg/dL   Hgb urine dipstick NEGATIVE NEGATIVE   Bilirubin Urine NEGATIVE NEGATIVE   Ketones, ur NEGATIVE  NEGATIVE mg/dL   Protein, ur NEGATIVE NEGATIVE mg/dL   Nitrite NEGATIVE NEGATIVE   Leukocytes, UA NEGATIVE NEGATIVE   Squamous Epithelial / LPF 0-5 0 - 5   WBC, UA NONE SEEN 0 - 5 WBC/hpf   RBC / HPF 0-5 0 - 5 RBC/hpf   Bacteria, UA RARE (A) NONE SEEN    Comment: Performed at [facility], [address], [place], [place] [address]  MRSA PCR Screening     Status: None   Collection Time: 08/23/18  2:33 AM  Result Value Ref Range   MRSA by PCR NEGATIVE NEGATIVE    Comment: Performed at [facility], [place] [address] [place], [place] [address]  CBC     Status: None   Collection Time: 08/23/18  4:19 AM  Result Value Ref Range   WBC 10.1 4.0 - 10.5 K/uL   RBC 4.79 4.22 - 5.81 MIL/uL   Hemoglobin 14.1 13.0 - 17.0 g/dL   HCT 41.8 39.0 - 52.0 %   MCV 87.3 78.0 - 100.0 fL   MCH 29.4 26.0 - 34.0 pg   MCHC 33.7 30.0 - 36.0 g/dL   RDW 13.4 11.5 - 15.5 %   Platelets 163 150 - 400 K/uL    Comment: Performed at [facility], [place] [address], [place], [place] [address]  Creatinine, serum     Status: None   Collection Time: 08/23/18  4:19 AM  Result Value Ref Range   Creatinine, Ser 0.94 0.61 - 1.24 mg/dL   GFR calc non Af Amer >60 >60 mL/min   GFR calc Af Amer >60 >60 mL/min    Comment: (NOTE) The eGFR has been calculated using the CKD EPI equation. This calculation has not been validated in all clinical situations. eGFR's persistently <60 mL/min signify possible Chronic Kidney Disease. Performed at [facility], [place] [address] [place], [place]  [address]    Dg Chest 2 View  Result Date: 08/22/2018 CLINICAL DATA:  Lower chest pain last night and today. Nausea. Current smoker. EXAM: CHEST - 2 VIEW COMPARISON:  None. FINDINGS: The heart size and mediastinal contours are within normal limits. Both lungs are clear. The visualized skeletal structures are unremarkable. IMPRESSION: No active cardiopulmonary disease. Electronically Signed   By: Lucienne Capers M.D.   On: 08/22/2018 21:03   US Abdomen Complete  Result Date: 08/22/2018 CLINICAL DATA:  Initial evaluation for acute epigastric and chest pain. EXAM: ABDOMEN ULTRASOUND COMPLETE COMPARISON:  None. FINDINGS: Gallbladder: Shadowing echogenic stones present within the gallbladder lumen, largest of which measures 1.6 cm. Associated sludge. Gallbladder wall mildly thickened up to 5 mm. No free pericholecystic fluid. Evaluation for sonographic Percell Miller sign limited as the patient was medicated. Note made of an 8 mm echogenic lesion seen adherent to the nondependent portion of the gallbladder wall (image 13). Common bile duct: Diameter: 6 mm Liver: No focal lesion identified. Diffusely increased echogenicity, suggesting steatosis. Portal vein is patent on color Doppler imaging with normal direction of blood flow towards the liver. IVC: No abnormality visualized. Pancreas: Visualized portion unremarkable. Spleen: Size and appearance within normal limits. Right Kidney: Length: 10.5 cm. Echogenicity within normal limits. No hydronephrosis. 3 mm nonobstructive stone noted. 1.5 cm simple cyst noted. Left Kidney: Length: 10.3 cm. Echogenicity within normal limits. No hydronephrosis. 4 mm nonobstructive stone noted. Few small simple cysts measuring up to 1.1 cm noted. Abdominal aorta: Atherosclerotic change noted within the intra-abdominal aorta. The aorta itself is ectatic  measuring up to 2.8 cm in diameter. Other findings: None. IMPRESSION: 1. Cholelithiasis with associated sludge and mild gallbladder wall  thickening. Clinical correlation for possible acute cholecystitis recommended. 2. No biliary dilatation. 3. Hepatic steatosis. 4. Bilateral nonobstructive nephrolithiasis as above. No hydronephrosis. 5. Bilateral simple renal cysts measuring up to 1.5 cm as above. 6. 8 mm echogenic lesion arising from the gallbladder wall. Finding is indeterminate, with differential considerations including a small polyp versus adenoma. Possible malignancy not entirely excluded. A follow-up ultrasound in 1 year to document stability is recommended. 7. Ectatic abdominal aorta measuring up to 2.8 cm in diameter, at risk for aneurysm development. Recommend followup by ultrasound in 5 years. This recommendation follows ACR consensus guidelines: White Paper of the ACR Incidental Findings Committee II on Vascular Findings. J Am Coll Radiol 2013; 10:789-794. Electronically Signed   By: Jeannine Boga M.D.   On: 08/22/2018 23:04   Assessment/Plan Acute calculus cholecystitis - N.p.o., IVF - IV Rocephin - OR today with Dr. Hassell Done for laparoscopic cholecystectomy with intraoperative cholangiogram - PRN analgesics and antiemetics  Jill Alexanders, [facility] 08/23/2018, 8:47 AM Pager: [phone] Consults: [phone]  Seen and agree.  Kaylyn Lim, MD, FACS

## 2018-08-23 NOTE — Transfer of Care (Signed)
Immediate Anesthesia Transfer of Care Note  Patient: Shawn KeelsJeffrey Allen  Procedure(s) Performed: LAPAROSCOPIC CHOLECYSTECTOMY WITH INTRAOPERATIVE CHOLANGIOGRAM (N/A )  Patient Location: PACU  Anesthesia Type:General  Level of Consciousness: awake, drowsy and patient cooperative  Airway & Oxygen Therapy: Patient Spontanous Breathing and Patient connected to face mask oxygen  Post-op Assessment: Report given to RN and Post -op Vital signs reviewed and stable  Post vital signs: Reviewed and stable  Last Vitals:  Vitals Value Taken Time  BP 182/103 08/23/2018  5:18 PM  Temp    Pulse 73 08/23/2018  5:21 PM  Resp 15 08/23/2018  5:24 PM  SpO2 100 % 08/23/2018  5:21 PM  Vitals shown include unvalidated device data.  Last Pain:  Vitals:   08/23/18 1440  TempSrc:   PainSc: 7       Patients Stated Pain Goal: 2 (08/23/18 0529)  Complications: No apparent anesthesia complications

## 2018-08-24 ENCOUNTER — Encounter (HOSPITAL_COMMUNITY): Payer: Self-pay | Admitting: Surgery

## 2018-08-24 MED ORDER — HYDROCODONE-ACETAMINOPHEN 5-325 MG PO TABS: 1.0000 | ORAL_TABLET | ORAL | 0 refills | Status: DC | PRN

## 2018-08-24 NOTE — Discharge Summary (Signed)
[facility] Discharge Summary   Patient ID: Shawn KeelsJeffrey Vroom MRN: [id number] DOB/AGE: 1961/10/28 57 y.o.  Admit date: 08/22/2018 Discharge date: 08/24/2018  Discharge Diagnosis Patient Active Problem List   Diagnosis Date Noted  . S/P laparoscopic cholecystectomy Sept 2019 08/23/2018  acute cholecystitis   Imaging: Dg Chest 2 View  Result Date: 08/22/2018 CLINICAL DATA:  Lower chest pain last night and today. Nausea. Current smoker. EXAM: CHEST - 2 VIEW COMPARISON:  None. FINDINGS: The heart size and mediastinal contours are within normal limits. Both lungs are clear. The visualized skeletal structures are unremarkable. IMPRESSION: No active cardiopulmonary disease. Electronically Signed   By: Burman NievesWilliam  Stevens M.D.   On: 08/22/2018 21:03   Dg Cholangiogram Operative  Result Date: 08/23/2018 CLINICAL DATA:  Cholelithiasis EXAM: INTRAOPERATIVE CHOLANGIOGRAM TECHNIQUE: Cholangiographic images from the C-arm fluoroscopic device were submitted for interpretation post-operatively. Please see the procedural report for the amount of contrast and the fluoroscopy time utilized. COMPARISON:  08/22/2018 FINDINGS: Intraoperative cholangiogram performed during the laparoscopic procedure. The residual cystic duct, biliary confluence, common hepatic duct, and common bile duct are patent. Contrast drains freely into the duodenum. No dilatation, obstruction, or significant filling defect. IMPRESSION: Patent biliary system. Electronically Signed   By: Judie PetitM.  Shick M.D.   On: 08/23/2018 16:41   [place] Abdomen Complete  Result Date: 08/22/2018 CLINICAL DATA:  Initial evaluation for acute epigastric and chest pain. EXAM: ABDOMEN ULTRASOUND COMPLETE COMPARISON:  None. FINDINGS: Gallbladder: Shadowing echogenic stones present within the gallbladder lumen, largest of which measures 1.6 cm. Associated sludge. Gallbladder wall mildly thickened up to 5 mm. No free pericholecystic fluid. Evaluation for sonographic  Eulah PontMurphy sign limited as the patient was medicated. Note made of an 8 mm echogenic lesion seen adherent to the nondependent portion of the gallbladder wall (image 13). Common bile duct: Diameter: 6 mm Liver: No focal lesion identified. Diffusely increased echogenicity, suggesting steatosis. Portal vein is patent on color Doppler imaging with normal direction of blood flow towards the liver. IVC: No abnormality visualized. Pancreas: Visualized portion unremarkable. Spleen: Size and appearance within normal limits. Right Kidney: Length: 10.5 cm. Echogenicity within normal limits. No hydronephrosis. 3 mm nonobstructive stone noted. 1.5 cm simple cyst noted. Left Kidney: Length: 10.3 cm. Echogenicity within normal limits. No hydronephrosis. 4 mm nonobstructive stone noted. Few small simple cysts measuring up to 1.1 cm noted. Abdominal aorta: Atherosclerotic change noted within the intra-abdominal aorta. The aorta itself is ectatic measuring up to 2.8 cm in diameter. Other findings: None. IMPRESSION: 1. Cholelithiasis with associated sludge and mild gallbladder wall thickening. Clinical correlation for possible acute cholecystitis recommended. 2. No biliary dilatation. 3. Hepatic steatosis. 4. Bilateral nonobstructive nephrolithiasis as above. No hydronephrosis. 5. Bilateral simple renal cysts measuring up to 1.5 cm as above. 6. 8 mm echogenic lesion arising from the gallbladder wall. Finding is indeterminate, with differential considerations including a small polyp versus adenoma. Possible malignancy not entirely excluded. A follow-up ultrasound in 1 year to document stability is recommended. 7. Ectatic abdominal aorta measuring up to 2.8 cm in diameter, at risk for aneurysm development. Recommend followup by ultrasound in 5 years. This recommendation follows ACR consensus guidelines: White Paper of the ACR Incidental Findings Committee II on Vascular Findings. J Am Coll Radiol 2013; 10:789-794. Electronically Signed    By: Rise MuBenjamin  McClintock M.D.   On: 08/22/2018 23:04    Procedures Dr. Luretha MurphyMatthew Martin (08/23/18) - [facility]  Hospital Course:  Mr. Shawn HudsonYoung is a 57 year old male with a history of  GERD and high cholesterol who presented to med [facility] on 08/22/2018 with a chief complaint of acute upper abdominal pain. RUQ U/S revealed acute cholecystitis.  Patient was admitted and underwent procedure listed above.  Tolerated procedure well and was transferred to the floor.  Diet was advanced as tolerated.  On POD#1, the patient was voiding well, tolerating diet, ambulating well, pain well controlled, vital signs stable, incisions c/d/i and felt stable for discharge home.  Patient will follow up in our office in 2 weeks and knows to call with questions or concerns.  Physical Exam: General:  Alert, NAD, pleasant, comfortable Abd:  Soft, ND, NT, incisions C/D/I  Allergies as of 08/24/2018      Reactions   Shellfish Allergy Other (See Comments)   Per patient, gets food poisoning symptoms       Medication List    STOP taking these medications   ranitidine 150 MG capsule Commonly known as:  ZANTAC     TAKE these medications   atorvastatin 20 MG tablet Commonly known as:  LIPITOR Take 20 mg by mouth daily.   HYDROcodone-acetaminophen 5-325 MG tablet Commonly known as:  NORCO/VICODIN Take 1 tablet by mouth every 4 (four) hours as needed for moderate pain.   pantoprazole 40 MG tablet Commonly known as:  PROTONIX Take 40 mg by mouth 2 (two) times daily.       Follow-up Information    [facility], [place]. Go on 09/07/2018.   Specialty:  General Surgery Why:  Your appointment is 10/03 at 1:30 pm Please arrive 30 minutes prior to your appointment to check in and fill out paperwork. Bring photo ID and insurance information. Contact information: [address] [place] [address] [phone]           Signed: Hosie Spangle, [facility] 08/24/2018, 11:42 AM

## 2018-08-24 NOTE — Progress Notes (Signed)
Pt alert, orienting, and tolerating diet. D/C instructions and prescription given. All questions answered.  Pt d/cd to home.

## 2018-08-24 NOTE — Progress Notes (Signed)
I have reviewed and concur with this student's documentation.   

## 2018-08-24 NOTE — Care Management Note (Signed)
Case Management Note  Patient Details  Name: Burke KeelsJeffrey Compean MRN: [id number] Date of Birth: Jan 29, 1961  Subjective/Objective: s/p lap chole.No CM needs.                   Action/Plan:dc home.   Expected Discharge Date:  08/24/18               Expected Discharge Plan:  Home/Self Care  In-House Referral:     Discharge planning Services  CM Consult  Post Acute Care Choice:    Choice offered to:     DME Arranged:    DME Agency:     HH Arranged:    HH Agency:     Status of Service:  Completed, signed off  If discussed at [facility] Length of Stay Meetings, dates discussed:    Additional Comments:  Lanier ClamMahabir, Colden Samaras, RN 08/24/2018, 10:26 AM

## 2019-01-08 ENCOUNTER — Emergency Department (HOSPITAL_BASED_OUTPATIENT_CLINIC_OR_DEPARTMENT_OTHER)
Admission: EM | Admit: 2019-01-08 | Discharge: 2019-01-08 | Disposition: A | Discharge status: Home / Self Care | Payer: BLUE CROSS/BLUE SHIELD | Attending: Emergency Medicine | Admitting: Emergency Medicine

## 2019-01-08 ENCOUNTER — Emergency Department (HOSPITAL_BASED_OUTPATIENT_CLINIC_OR_DEPARTMENT_OTHER): Payer: BLUE CROSS/BLUE SHIELD

## 2019-01-08 ENCOUNTER — Other Ambulatory Visit: Payer: Self-pay

## 2019-01-08 ENCOUNTER — Encounter (HOSPITAL_BASED_OUTPATIENT_CLINIC_OR_DEPARTMENT_OTHER): Payer: Self-pay | Admitting: Emergency Medicine

## 2019-01-08 DIAGNOSIS — R0789 Other chest pain: Secondary | ICD-10-CM | POA: Diagnosis not present

## 2019-01-08 DIAGNOSIS — F17210 Nicotine dependence, cigarettes, uncomplicated: Secondary | ICD-10-CM | POA: Diagnosis not present

## 2019-01-08 DIAGNOSIS — R05 Cough: Secondary | ICD-10-CM | POA: Insufficient documentation

## 2019-01-08 DIAGNOSIS — R0981 Nasal congestion: Secondary | ICD-10-CM | POA: Diagnosis not present

## 2019-01-08 DIAGNOSIS — R1013 Epigastric pain: Secondary | ICD-10-CM | POA: Diagnosis not present

## 2019-01-08 DIAGNOSIS — H6691 Otitis media, unspecified, right ear: Secondary | ICD-10-CM

## 2019-01-08 DIAGNOSIS — R079 Chest pain, unspecified: Secondary | ICD-10-CM

## 2019-01-08 HISTORY — DX: Cocaine abuse, uncomplicated: F14.10

## 2019-01-08 LAB — COMPREHENSIVE METABOLIC PANEL
ALK PHOS: 70 U/L (ref 38–126)
ALT: 27 U/L (ref 0–44)
ANION GAP: 7 (ref 5–15)
AST: 25 U/L (ref 15–41)
Albumin: 4.2 g/dL (ref 3.5–5.0)
BUN: 14 mg/dL (ref 6–20)
CO2: 25 mmol/L (ref 22–32)
Calcium: 8.9 mg/dL (ref 8.9–10.3)
Chloride: 106 mmol/L (ref 98–111)
Creatinine, Ser: 0.93 mg/dL (ref 0.61–1.24)
GFR calc non Af Amer: >60 mL/min (ref >60)
Glucose, Bld: 140 mg/dL — ABNORMAL HIGH (ref 70–99)
Potassium: 3.7 mmol/L (ref 3.5–5.1)
SODIUM: 138 mmol/L (ref 135–145)
Total Bilirubin: 0.8 mg/dL (ref 0.3–1.2)
Total Protein: 6.8 g/dL (ref 6.5–8.1)

## 2019-01-08 LAB — CBC WITH DIFFERENTIAL/PLATELET
Abs Immature Granulocytes: 0.02 10*3/uL (ref 0.00–0.07)
Basophils Absolute: 0.1 10*3/uL (ref 0.0–0.1)
Basophils Relative: 1 %
Eosinophils Absolute: 0.5 10*3/uL (ref 0.0–0.5)
Eosinophils Relative: 6 %
HCT: 45.1 % (ref 39.0–52.0)
Hemoglobin: 15.2 g/dL (ref 13.0–17.0)
Immature Granulocytes: 0 %
LYMPHS PCT: 21 %
Lymphs Abs: 1.8 10*3/uL (ref 0.7–4.0)
MCH: 28.8 pg (ref 26.0–34.0)
MCHC: 33.7 g/dL (ref 30.0–36.0)
MCV: 85.4 fL (ref 80.0–100.0)
Monocytes Absolute: 0.6 10*3/uL (ref 0.1–1.0)
Monocytes Relative: 8 %
Neutro Abs: 5.4 10*3/uL (ref 1.7–7.7)
Neutrophils Relative %: 64 %
Platelets: 171 10*3/uL (ref 150–400)
RBC: 5.28 MIL/uL (ref 4.22–5.81)
RDW: 12.6 % (ref 11.5–15.5)
WBC: 8.4 10*3/uL (ref 4.0–10.5)
nRBC: 0 % (ref 0.0–0.2)

## 2019-01-08 LAB — TROPONIN I
Troponin I: <0.03 ng/mL (ref <0.03)
Troponin I: <0.03 ng/mL (ref <0.03)

## 2019-01-08 LAB — LIPASE, BLOOD: Lipase: 22 U/L (ref 11–51)

## 2019-01-08 MED ORDER — PANTOPRAZOLE SODIUM 40 MG PO TBEC: 40.0000 mg | DELAYED_RELEASE_TABLET | Freq: Two times a day (BID) | ORAL | 0 refills | Status: AC

## 2019-01-08 MED ORDER — AMOXICILLIN-POT CLAVULANATE 875-125 MG PO TABS: 1.0000 | ORAL_TABLET | Freq: Two times a day (BID) | ORAL | 0 refills | Status: AC

## 2019-01-08 MED ORDER — ATORVASTATIN CALCIUM 20 MG PO TABS: 20.0000 mg | ORAL_TABLET | Freq: Every day | ORAL | 1 refills | Status: AC

## 2019-01-08 MED FILL — PANTOPRAZOLE SOD DR 40 MG T: 40 | 14 days supply | Qty: 28 | Fill #0

## 2019-01-08 MED FILL — AMOX-CLAV 875-125 MG TABLET: 875-125 | 7 days supply | Qty: 14 | Fill #0

## 2019-01-08 MED FILL — ATORVASTATIN 20 MG TABLET: 20 | 30 days supply | Qty: 30 | Fill #0

## 2019-01-08 NOTE — ED Provider Notes (Signed)
[facility] EMERGENCY DEPARTMENT Provider Note   CSN: [id number] Arrival date & time: 01/08/19  0910     History   Chief Complaint Chief Complaint  Patient presents with  . Chest Pain  . Otalgia    HPI Shawn Allen is a 58 y.o. male.  HPI   Presents with ear pain and chest pain Describes burning and stretching time feeling left lower chest, no radiation of pain.  Nothing makes it better or worse.  When asked if exertional he does say yes.  Reports it has been present for a few days, reports it comes and goes but has been present more often than it is not there.  Reports sometimes he is able to exert himself without pain, and that pain is more random in timing. Not necessarily worse with eating.  Not worse with deep breaths.  Reports pain 4-5/10.  Reminds him of when he had his gallbladder removed. Not positional. Reports the pain was more extreme before in epigastrium and was diagnosed with GB disease.  CP on and off for a few days.  Has been constant since this AM.   Smoke cigarettes, no other drugs No family hx of early heart disease  Hx of hlpd, no htn/DM. Was borderline DM.  No recent trips car or airplane, had surgery 08/2018 no other surgeries, no leg pain or swelling (foot callus and pain in that area at times.)  No nausea, no diaphoresis, dyspnea, dizziness, palpitations, no vomiting or abdominal pain.  Cough for months, maybe smokers cough.  No fevers.   Ear pain for 2 days, has had some congestion and drainage from throat.  Both sides.  2/10. Feels it with eating and drinking cold beverages. No dental pain.   Past Medical History:  Diagnosis Date  . Cocaine abuse (HCC)   . GERD (gastroesophageal reflux disease)   . Hypercholesteremia     Patient Active Problem List   Diagnosis Date Noted  . S/P laparoscopic cholecystectomy Sept 2019 08/23/2018    Past Surgical History:  Procedure Laterality Date  . CHOLECYSTECTOMY N/A 08/23/2018   Procedure:  LAPAROSCOPIC CHOLECYSTECTOMY WITH INTRAOPERATIVE CHOLANGIOGRAM;  Surgeon: Luretha Murphy, MD;  Location: [facility];  Service: General;  Laterality: N/A;        Home Medications    Prior to Admission medications   Medication Sig Start Date End Date Taking? Authorizing Provider  amoxicillin-clavulanate (AUGMENTIN) 875-125 MG tablet Take 1 tablet by mouth every 12 (twelve) hours. 01/08/19   Alvira Monday, MD  atorvastatin (LIPITOR) 20 MG tablet Take 1 tablet (20 mg total) by mouth daily. 01/08/19   Alvira Monday, MD  pantoprazole (PROTONIX) 40 MG tablet Take 1 tablet (40 mg total) by mouth 2 (two) times daily for 14 days. 01/08/19 01/22/19  Alvira Monday, MD    Family History No family history on file.  Social History Social History   Tobacco Use  . Smoking status: Current Every Day Smoker    Types: Cigarettes  . Smokeless tobacco: Never Used  Substance Use Topics  . Alcohol use: Yes    Comment: occ  . Drug use: Not on file    Comment: hx of cocaine abuse     Allergies   Shellfish allergy   Review of Systems Review of Systems  Constitutional: Negative for fever.  HENT: Positive for congestion and ear pain. Negative for sore throat.   Eyes: Negative for visual disturbance.  Respiratory: Positive for cough. Negative for shortness of breath.   Cardiovascular: Positive  for chest pain. Negative for palpitations and leg swelling.  Gastrointestinal: Negative for abdominal pain, nausea and vomiting.  Genitourinary: Negative for difficulty urinating.  Musculoskeletal: Negative for back pain and neck stiffness.  Skin: Negative for rash.  Neurological: Negative for syncope, light-headedness and headaches.     Physical Exam Updated Vital Signs BP (!) 138/93 (BP Location: Right Arm)   Pulse 67   Temp 98.1 F (36.7 C) (Oral)   Resp 19   Ht 5\' 10"  (1.778 m)   Wt 86.2 kg   SpO2 92% Comment: notified RN , sats stable, smoker  BMI 27.26 kg/m   Physical Exam Vitals signs  and nursing note reviewed.  Constitutional:      General: He is not in acute distress.    Appearance: He is well-developed. He is not diaphoretic.  HENT:     Head: Normocephalic and atraumatic.  Eyes:     Conjunctiva/sclera: Conjunctivae normal.     Comments: Right TM bulging  Neck:     Musculoskeletal: Normal range of motion.  Cardiovascular:     Rate and Rhythm: Normal rate and regular rhythm.     Heart sounds: Normal heart sounds. No murmur. No friction rub. No gallop.   Pulmonary:     Effort: Pulmonary effort is normal. No respiratory distress.     Breath sounds: Normal breath sounds. No wheezing or rales.  Abdominal:     General: There is no distension.     Palpations: Abdomen is soft.     Tenderness: There is abdominal tenderness (epigastric, LUQ, mild). There is no guarding.  Skin:    General: Skin is warm and dry.  Neurological:     Mental Status: He is alert and oriented to person, place, and time.      ED Treatments / Results  Labs (all labs ordered are listed, but only abnormal results are displayed) Labs Reviewed  COMPREHENSIVE METABOLIC PANEL - Abnormal; Notable for the following components:      Result Value   Glucose, Bld 140 (*)    All other components within normal limits  TROPONIN I  CBC WITH DIFFERENTIAL/PLATELET  LIPASE, BLOOD  TROPONIN I    EKG EKG Interpretation  Date/Time:  Monday January 08 2019 09:19:41 EST Ventricular Rate:  72 PR Interval:    QRS Duration: 98 QT Interval:  377 QTC Calculation: 413 R Axis:   22 Text Interpretation:  Sinus rhythm No significant change since last tracing Confirmed by Alvira MondaySchlossman, Celina Shiley ([id number]) on 01/08/2019 9:58:44 AM   Radiology Dg Chest 1 View  Result Date: 01/08/2019 CLINICAL DATA:  Left-sided chest pain EXAM: CHEST  1 VIEW COMPARISON:  01/08/2019 FINDINGS: Cardiac shadows within normal limits. Lungs are well aerated bilaterally. Previously seen density in the right base has resolved. No bony  abnormality is noted. IMPRESSION: No acute abnormality seen Electronically Signed   By: Alcide CleverMark  Lukens M.D.   On: 01/08/2019 11:33   Dg Chest 2 View  Result Date: 01/08/2019 CLINICAL DATA:  Chest pain. EXAM: CHEST - 2 VIEW COMPARISON:  08/22/2018 FINDINGS: There is increased density medially at the right lung base as compared to the prior exam. This may represent a confluence of vascular structures. I recommend a repeat PA chest with deeper inspiration and if the density persists, CT scan chest is recommended. Heart size and vascularity are normal. No effusions. No bone abnormality. IMPRESSION: 1. Repeat PA chest with deeper inspiration is recommended for further evaluation of increased density at the right base medially. 2.  No other significant abnormalities. Electronically Signed   By: Francene BoyersJames  Maxwell M.D.   On: 01/08/2019 10:30    Procedures Procedures (including critical care time)  Medications Ordered in ED Medications - No data to display   Initial Impression / Assessment and Plan / ED Course  I have reviewed the triage vital signs and the nursing notes.  Pertinent labs & imaging results that were available during my care of the patient were reviewed by me and considered in my medical decision making (see chart for details).     58yo male with history of hyperlipidemia, smoking, presents with concern for chest pain and ear pain.  Differential diagnosis for chest pain includes pulmonary embolus, dissection, pneumothorax, pneumonia, ACS, myocarditis, pericarditis.  EKG was done and evaluate by me and showed no acute ST changes and no signs of pericarditis. Chest x-ray was done and evaluated by me and radiology and showed no sign of pneumonia or pneumothorax. No dyspnea, no pleuritic pain, no hypoxia or tachycardia, doubt PE.  Patient is low risk HEART score and had delta troponins which were both negative. Given this evaluation, history and physical have low suspicion for pulmonary embolus,  pneumonia, ACS, myocarditis, pericarditis, dissection.  Some epigastric tenderness on exam, normal transaminases and lipase.  Given tenderness and burning pain suspect gastritis or GERD. Given rx for protonix. Also has ear pain-signs of otitis media on exam. Given rx for augmentin.  Recommend close PCP follow up, discussed smoking cessation. Patient discharged in stable condition with understanding of reasons to return.      Final Clinical Impressions(s) / ED Diagnoses   Final diagnoses:  Chest pain, unspecified type  Right otitis media, unspecified otitis media type    ED Discharge Orders         Ordered    amoxicillin-clavulanate (AUGMENTIN) 875-125 MG tablet  Every 12 hours     01/08/19 1414    pantoprazole (PROTONIX) 40 MG tablet  2 times daily     01/08/19 1414    atorvastatin (LIPITOR) 20 MG tablet  Daily     01/08/19 1422           Alvira MondaySchlossman, Clema Skousen, MD 01/09/19 [phone]

## 2019-01-08 NOTE — ED Triage Notes (Signed)
Pt c/o lower LT chest discomfort for a couple days; sts feels similar to pain he had before gallbladder removed; c/o bil ear pain x 2 d

## 2020-07-29 IMAGING — CR DG CHEST 2V
2 series · 2 of 2 positions shown · non-contrast
Comparison: 08/22/2018

CLINICAL DATA: Chest pain.

EXAM:
CHEST - 2 VIEW

[w chest pa]
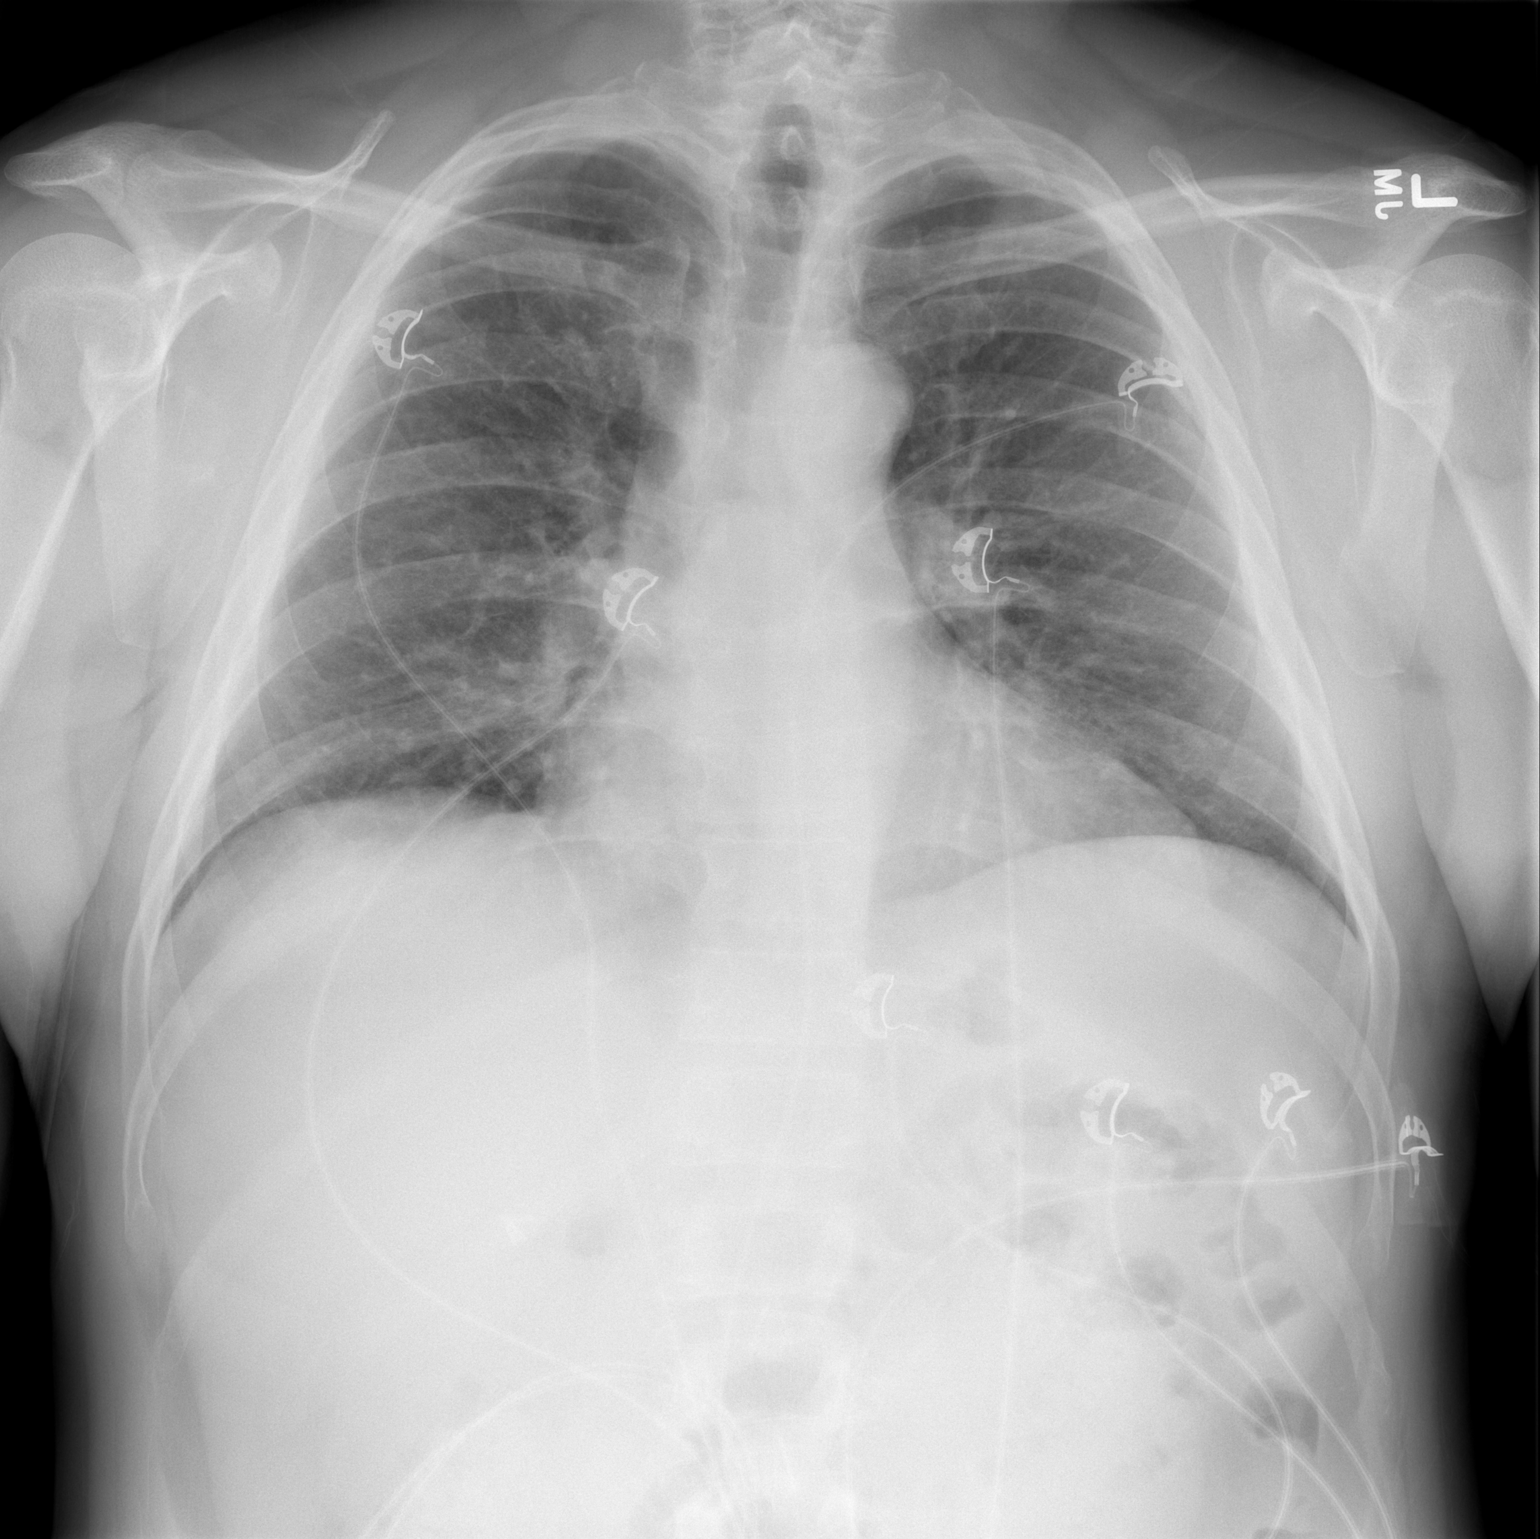

[w chest lat]
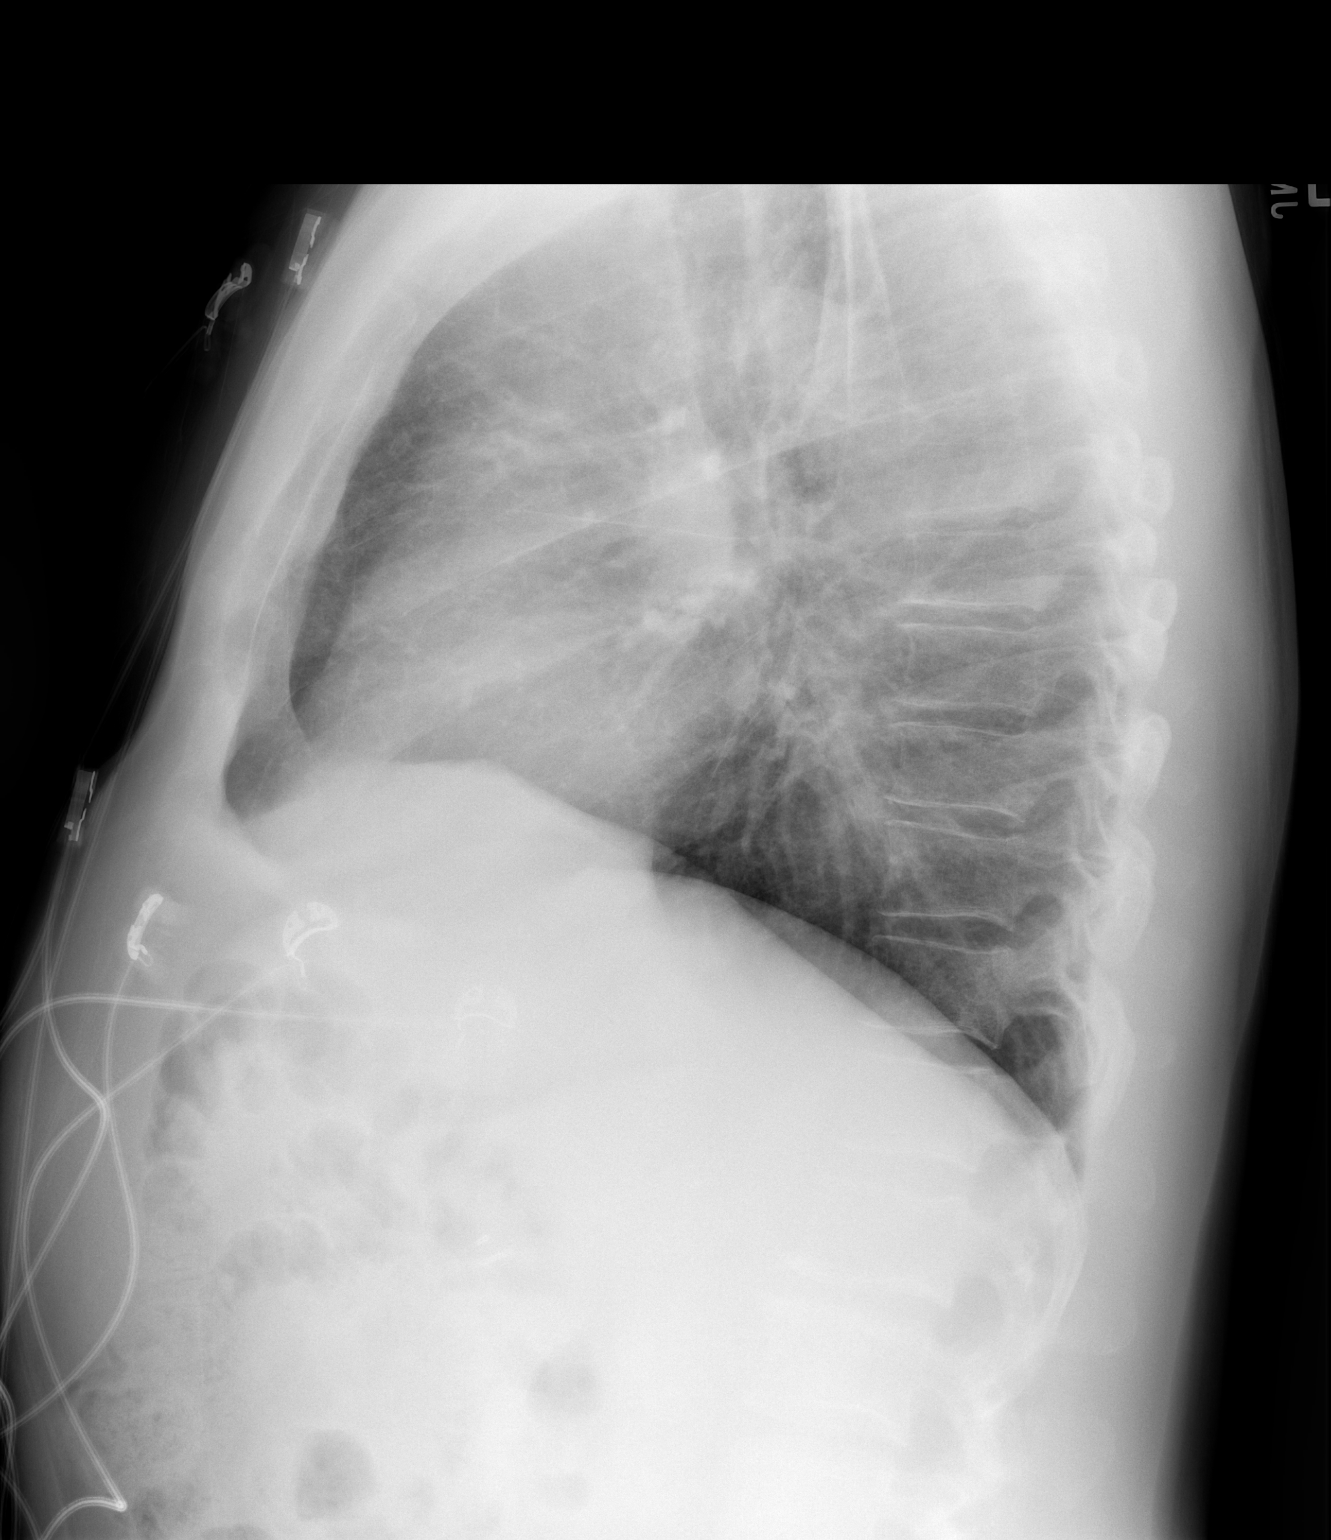

[2 of 2 positions shown; findings below may reference images not displayed]

FINDINGS: There is increased density medially at the right lung base as
compared to the prior exam. This may represent a confluence of
vascular structures. I recommend a repeat PA chest with deeper
inspiration and if the density persists, CT scan chest is
recommended.

Heart size and vascularity are normal. No effusions. No bone
abnormality.
IMPRESSION: 1. Repeat PA chest with deeper inspiration is recommended for
further evaluation of increased density at the right base medially.
2. No other significant abnormalities.

## 2023-06-20 ENCOUNTER — Other Ambulatory Visit: Payer: Self-pay

## 2023-06-20 ENCOUNTER — Emergency Department (HOSPITAL_BASED_OUTPATIENT_CLINIC_OR_DEPARTMENT_OTHER): Payer: BLUE CROSS/BLUE SHIELD

## 2023-06-20 ENCOUNTER — Emergency Department (HOSPITAL_BASED_OUTPATIENT_CLINIC_OR_DEPARTMENT_OTHER)
Admission: EM | Admit: 2023-06-20 | Discharge: 2023-06-20 | Disposition: A | Discharge status: Home / Self Care | Payer: BLUE CROSS/BLUE SHIELD | Attending: Emergency Medicine | Admitting: Emergency Medicine

## 2023-06-20 DIAGNOSIS — I10 Essential (primary) hypertension: Secondary | ICD-10-CM

## 2023-06-20 DIAGNOSIS — Z79899 Other long term (current) drug therapy: Secondary | ICD-10-CM | POA: Diagnosis not present

## 2023-06-20 DIAGNOSIS — R079 Chest pain, unspecified: Secondary | ICD-10-CM | POA: Diagnosis not present

## 2023-06-20 LAB — CBC
HCT: 40.3 % (ref 39.0–52.0)
Hemoglobin: 14.1 g/dL (ref 13.0–17.0)
MCH: 29.1 pg (ref 26.0–34.0)
MCHC: 35 g/dL (ref 30.0–36.0)
MCV: 83.1 fL (ref 80.0–100.0)
Platelets: 165 10*3/uL (ref 150–400)
RBC: 4.85 MIL/uL (ref 4.22–5.81)
RDW: 13 % (ref 11.5–15.5)
WBC: 9.2 10*3/uL (ref 4.0–10.5)
nRBC: 0 % (ref 0.0–0.2)

## 2023-06-20 LAB — BASIC METABOLIC PANEL
Anion gap: 10 (ref 5–15)
BUN: 13 mg/dL (ref 6–20)
CO2: 23 mmol/L (ref 22–32)
Calcium: 9 mg/dL (ref 8.9–10.3)
Chloride: 106 mmol/L (ref 98–111)
Creatinine, Ser: 1.31 mg/dL — ABNORMAL HIGH (ref 0.61–1.24)
GFR, Estimated: >60 mL/min (ref >60)
Glucose, Bld: 129 mg/dL — ABNORMAL HIGH (ref 70–99)
Potassium: 3.6 mmol/L (ref 3.5–5.1)
Sodium: 139 mmol/L (ref 135–145)

## 2023-06-20 LAB — D-DIMER, QUANTITATIVE: D-Dimer, Quant: 0.63 ug/mL-FEU — ABNORMAL HIGH (ref 0.00–0.50)

## 2023-06-20 LAB — TROPONIN I (HIGH SENSITIVITY)
Troponin I (High Sensitivity): 18 ng/L — ABNORMAL HIGH (ref <18)
Troponin I (High Sensitivity): 20 ng/L — ABNORMAL HIGH (ref <18)

## 2023-06-20 MED ORDER — IOHEXOL 350 MG/ML SOLN
75.0000 mL | Freq: Once | INTRAVENOUS | Status: AC | PRN
  Administered 2023-06-20: 75 mL via INTRAVENOUS

## 2023-06-20 MED ORDER — AMLODIPINE BESYLATE 5 MG PO TABS: 5.0000 mg | ORAL_TABLET | Freq: Every day | ORAL | 0 refills | Status: AC

## 2023-06-20 MED ORDER — AMLODIPINE BESYLATE 5 MG PO TABS
5.0000 mg | ORAL_TABLET | Freq: Once | ORAL | Status: AC
  Administered 2023-06-20: 5 mg via ORAL
  Filled 2023-06-20: qty 1

## 2023-06-20 NOTE — ED Triage Notes (Signed)
Patient presents to ED via POV from home. Here with chest pain that began last night. Denies nausea, vomiting or shortness of breath.

## 2023-06-20 NOTE — Discharge Instructions (Addendum)
Follow-up with your doctor or cardiology soon as possible.  Your troponin was mildly elevated but stable.  This story was not as worrisome however for heart problems. Start the blood pressure medicine.

## 2023-06-20 NOTE — ED Provider Notes (Signed)
[place] EMERGENCY DEPARTMENT AT [facility] Provider Note   CSN: [id number] Arrival date & time: 06/20/23  1537     History  Chief Complaint  Patient presents with   Chest Pain    Shawn Allen is a 62 y.o. male.   Chest Pain Patient presents with chest pain.  Began last night.  Lambert Mody and comes and goes.  Left lower chest.  Comes on the last couple seconds.  No nausea or vomiting.  No fevers or chills.  No change with eating.  No shortness of breath.    Past Medical History:  Diagnosis Date   Cocaine abuse (HCC)    GERD (gastroesophageal reflux disease)    Hypercholesteremia     Home Medications Prior to Admission medications   Medication Sig Start Date End Date Taking? Authorizing Provider  amLODipine (NORVASC) 5 MG tablet Take 1 tablet (5 mg total) by mouth daily. 06/20/23  Yes Benjiman Core, MD  amoxicillin-clavulanate (AUGMENTIN) 875-125 MG tablet Take 1 tablet by mouth every 12 (twelve) hours. 01/08/19   Alvira Monday, MD  atorvastatin (LIPITOR) 20 MG tablet Take 1 tablet (20 mg total) by mouth daily. 01/08/19   Alvira Monday, MD  pantoprazole (PROTONIX) 40 MG tablet Take 1 tablet (40 mg total) by mouth 2 (two) times daily for 14 days. 01/08/19 01/22/19  Alvira Monday, MD      Allergies    Shellfish allergy    Review of Systems   Review of Systems  Cardiovascular:  Positive for chest pain.    Physical Exam Updated Vital Signs BP (!) 151/89   Pulse 66   Temp 98.1 F (36.7 C) (Oral)   Resp 17   Ht 5\' 10"  (1.778 m)   Wt 84.4 kg   SpO2 99%   BMI 26.69 kg/m  Physical Exam Vitals and nursing note reviewed.  Cardiovascular:     Rate and Rhythm: Normal rate and regular rhythm.  Pulmonary:     Breath sounds: No decreased breath sounds, wheezing or rhonchi.  Chest:     Chest wall: No tenderness.  Abdominal:     Tenderness: There is no abdominal tenderness.  Musculoskeletal:     Right lower leg: No edema.     Left lower leg: No edema.   Neurological:     Mental Status: He is alert.     ED Results / Procedures / Treatments   Labs (all labs ordered are listed, but only abnormal results are displayed) Labs Reviewed  BASIC METABOLIC PANEL - Abnormal; Notable for the following components:      Result Value   Glucose, Bld 129 (*)    Creatinine, Ser 1.31 (*)    All other components within normal limits  D-DIMER, QUANTITATIVE - Abnormal; Notable for the following components:   D-Dimer, Quant 0.63 (*)    All other components within normal limits  TROPONIN I (HIGH SENSITIVITY) - Abnormal; Notable for the following components:   Troponin I (High Sensitivity) 18 (*)    All other components within normal limits  TROPONIN I (HIGH SENSITIVITY) - Abnormal; Notable for the following components:   Troponin I (High Sensitivity) 20 (*)    All other components within normal limits  CBC    EKG EKG Interpretation Date/Time:  Monday June 20 2023 15:46:14 EDT Ventricular Rate:  75 PR Interval:  142 QRS Duration:  95 QT Interval:  370 QTC Calculation: 414 R Axis:   54  Text Interpretation: Sinus rhythm No significant change since last  tracing Confirmed by Benjiman Core [id number] on 06/20/2023 4:11:58 PM  Radiology CT Angio Chest PE W and/or Wo Contrast  Result Date: 06/20/2023 CLINICAL DATA:  Chest pain, elevated D-dimer. Pulmonary embolism suspected. EXAM: CT ANGIOGRAPHY CHEST WITH CONTRAST TECHNIQUE: Multidetector CT imaging of the chest was performed using the standard protocol during bolus administration of intravenous contrast. Multiplanar CT image reconstructions and MIPs were obtained to evaluate the vascular anatomy. RADIATION DOSE REDUCTION: This exam was performed according to the departmental dose-optimization program which includes automated exposure control, adjustment of the mA and/or kV according to patient size and/or use of iterative reconstruction technique. CONTRAST:  75mL OMNIPAQUE IOHEXOL 350 MG/ML SOLN  COMPARISON:  Chest radiograph dated June 20, 2023 FINDINGS: Cardiovascular: Satisfactory opacification of the pulmonary arteries to the segmental level. No evidence of pulmonary embolism. Normal heart size. No pericardial effusion. Mediastinum/Nodes: No enlarged mediastinal, hilar, or axillary lymph nodes. Thyroid gland, trachea, and esophagus demonstrate no significant findings. Lungs/Pleura: Centrilobular emphysematous changes. No focal consolidation or pleural effusion. No pleural effusion or pneumothorax. Upper Abdomen: No acute abnormality. Musculoskeletal: No chest wall abnormality. No acute or significant osseous findings. Review of the MIP images confirms the above findings. IMPRESSION: 1. No evidence of pulmonary embolism or other acute intrathoracic process. 2. Centrilobular emphysematous changes. Emphysema (ICD10-J43.9). Electronically Signed   By: Larose Hires D.O.   On: 06/20/2023 19:52   DG Chest 2 View  Result Date: 06/20/2023 CLINICAL DATA:  Chest pain EXAM: CHEST - 2 VIEW COMPARISON:  01/08/2019 FINDINGS: The heart size and mediastinal contours are within normal limits. Aortic atherosclerosis. Both lungs are clear. The visualized skeletal structures are unremarkable. IMPRESSION: No active cardiopulmonary disease. Electronically Signed   By: Jasmine Pang M.D.   On: 06/20/2023 15:56    Procedures Procedures    Medications Ordered in ED Medications  iohexol (OMNIPAQUE) 350 MG/ML injection 75 mL (75 mLs Intravenous Contrast Given 06/20/23 1902)  amLODipine (NORVASC) tablet 5 mg (5 mg Oral Given 06/20/23 2034)    ED Course/ Medical Decision Making/ A&P                             Medical Decision Making Amount and/or Complexity of Data Reviewed Labs: ordered. Radiology: ordered.  Risk Prescription drug management.   Patient with chest pain.  Sharp left-sided.  Comes and goes.  Not exertional.  Not associated with eating.  Differential diagnosis includes musculoskeletal pain,  cardiac cause, pneumonia, pulm embolism.  Pulm embolism felt less likely.  Pain is not constant.  Does not feel short of breath and not pleuritic.  Cardiac ischemia also felt less likely but will get troponin.  Chest x-ray reassuring.  No pneumothorax.  Initial troponin elevated at 18.  Second troponin at 20.  This is stable.  Does have some hypertension.  No known history of same.  The chest pain was only sharp left side not exertional.  Comes and goes and only lasts a second or 2.  D-dimer done and was not negative.  CT scan done reassuring.  Will discharge home with short-term follow-up with cardiology.  Potentially troponin elevated due to the high blood pressure.  Ischemic cause felt less likely but will return for worsening symptoms.  Will discharge.        Final Clinical Impression(s) / ED Diagnoses Final diagnoses:  Nonspecific chest pain  Hypertension, unspecified type    Rx / DC Orders ED Discharge Orders  Ordered    amLODipine (NORVASC) 5 MG tablet  Daily        06/20/23 2024    Ambulatory referral to Cardiology       Comments: If you have not heard from the Cardiology office within the next 72 hours please call [phone].   06/20/23 2024              Benjiman Core, MD 06/20/23 [phone]

## 2024-02-07 ENCOUNTER — Other Ambulatory Visit: Payer: Self-pay

## 2024-02-07 ENCOUNTER — Emergency Department (HOSPITAL_BASED_OUTPATIENT_CLINIC_OR_DEPARTMENT_OTHER)

## 2024-02-07 ENCOUNTER — Emergency Department (HOSPITAL_BASED_OUTPATIENT_CLINIC_OR_DEPARTMENT_OTHER)
Admission: EM | Admit: 2024-02-07 | Discharge: 2024-02-07 | Disposition: A | Discharge status: Home / Self Care | Attending: Emergency Medicine | Admitting: Emergency Medicine

## 2024-02-07 ENCOUNTER — Encounter (HOSPITAL_BASED_OUTPATIENT_CLINIC_OR_DEPARTMENT_OTHER): Payer: Self-pay

## 2024-02-07 DIAGNOSIS — M79622 Pain in left upper arm: Secondary | ICD-10-CM | POA: Insufficient documentation

## 2024-02-07 DIAGNOSIS — R079 Chest pain, unspecified: Secondary | ICD-10-CM

## 2024-02-07 DIAGNOSIS — E119 Type 2 diabetes mellitus without complications: Secondary | ICD-10-CM | POA: Diagnosis not present

## 2024-02-07 DIAGNOSIS — R0789 Other chest pain: Secondary | ICD-10-CM | POA: Diagnosis present

## 2024-02-07 LAB — BASIC METABOLIC PANEL
Anion gap: 12 (ref 5–15)
BUN: 14 mg/dL (ref 8–23)
CO2: 24 mmol/L (ref 22–32)
Calcium: 9.1 mg/dL (ref 8.9–10.3)
Chloride: 102 mmol/L (ref 98–111)
Creatinine, Ser: 0.95 mg/dL (ref 0.61–1.24)
GFR, Estimated: >60 mL/min (ref >60)
Glucose, Bld: 243 mg/dL — ABNORMAL HIGH (ref 70–99)
Potassium: 3.6 mmol/L (ref 3.5–5.1)
Sodium: 138 mmol/L (ref 135–145)

## 2024-02-07 LAB — CBC
HCT: 45.1 % (ref 39.0–52.0)
Hemoglobin: 15.2 g/dL (ref 13.0–17.0)
MCH: 28.4 pg (ref 26.0–34.0)
MCHC: 33.7 g/dL (ref 30.0–36.0)
MCV: 84.3 fL (ref 80.0–100.0)
Platelets: 182 10*3/uL (ref 150–400)
RBC: 5.35 MIL/uL (ref 4.22–5.81)
RDW: 13.3 % (ref 11.5–15.5)
WBC: 7.9 10*3/uL (ref 4.0–10.5)
nRBC: 0 % (ref 0.0–0.2)

## 2024-02-07 LAB — TROPONIN I (HIGH SENSITIVITY)
Troponin I (High Sensitivity): 5 ng/L (ref <18)
Troponin I (High Sensitivity): 5 ng/L (ref <18)

## 2024-02-07 MED ORDER — IBUPROFEN 400 MG PO TABS
600.0000 mg | ORAL_TABLET | Freq: Once | ORAL | Status: AC
  Administered 2024-02-07: 600 mg via ORAL
  Filled 2024-02-07: qty 1

## 2024-02-07 NOTE — ED Provider Notes (Signed)
 [place] [facility] Provider Note   CSN: [id number] Arrival date & time: 02/07/24  8416     History  Chief Complaint  Patient presents with   Chest Pain    Shawn Allen is a 63 y.o. male.  HPI 63 year old male presents with chest pain.  Has been on and off for 2 days.  Pain is a sharp pain in his left chest that will come on and last a few seconds.  He is also had more consistent and dull left upper arm pain.  No trauma to the arm but he does work in [employer] and thinks he could have done something to it.  No swelling.  Nothing specific makes the chest pain come.  No shortness of breath, cough, fever, abdominal pain.  No leg swelling or recent surgery/history of DVT.  Has a history of diabetes as well as also has calcifications in his coronary arteries seen on CT as well as aortic atherosclerosis.  Home Medications Prior to Admission medications   Medication Sig Start Date End Date Taking? Authorizing Provider  amLODipine (NORVASC) 5 MG tablet Take 1 tablet (5 mg total) by mouth daily. 06/20/23   Benjiman Core, MD  amoxicillin-clavulanate (AUGMENTIN) 875-125 MG tablet Take 1 tablet by mouth every 12 (twelve) hours. 01/08/19   Alvira Monday, MD  atorvastatin (LIPITOR) 20 MG tablet Take 1 tablet (20 mg total) by mouth daily. 01/08/19   Alvira Monday, MD  pantoprazole (PROTONIX) 40 MG tablet Take 1 tablet (40 mg total) by mouth 2 (two) times daily for 14 days. 01/08/19 01/22/19  Alvira Monday, MD      Allergies    Shellfish allergy    Review of Systems   Review of Systems  Constitutional:  Negative for fever.  Respiratory:  Negative for cough and shortness of breath.   Cardiovascular:  Positive for chest pain. Negative for leg swelling.  Gastrointestinal:  Negative for abdominal pain.  Musculoskeletal:  Positive for myalgias.    Physical Exam Updated Vital Signs BP (!) 153/90   Pulse (!) 55   Temp 97.9 F (36.6 C)   Resp 15    Ht 5\' 10"  (1.778 m)   Wt 81.6 kg   SpO2 93%   BMI 25.83 kg/m  Physical Exam Vitals and nursing note reviewed.  Constitutional:      Appearance: He is well-developed.  HENT:     Head: Normocephalic and atraumatic.  Cardiovascular:     Rate and Rhythm: Normal rate and regular rhythm.     Pulses:          Radial pulses are 2+ on the left side.     Heart sounds: Normal heart sounds.  Pulmonary:     Effort: Pulmonary effort is normal.     Breath sounds: Normal breath sounds.  Abdominal:     Palpations: Abdomen is soft.     Tenderness: There is no abdominal tenderness.  Musculoskeletal:     Left shoulder: No tenderness. Normal range of motion.     Left upper arm: Tenderness (mild) present. No swelling.     Left elbow: Normal range of motion. No tenderness.       Arms:     Right lower leg: No edema.     Left lower leg: No edema.  Skin:    General: Skin is warm and dry.  Neurological:     Mental Status: He is alert.     ED Results / Procedures / Treatments  Labs (all labs ordered are listed, but only abnormal results are displayed) Labs Reviewed  BASIC METABOLIC PANEL - Abnormal; Notable for the following components:      Result Value   Glucose, Bld 243 (*)    All other components within normal limits  RESP PANEL BY RT-PCR (RSV, FLU A&B, COVID)  [id number]  CBC  TROPONIN I (HIGH SENSITIVITY)  TROPONIN I (HIGH SENSITIVITY)    EKG EKG Interpretation Date/Time:  Tuesday February 07 2024 11:36:07 EST Ventricular Rate:  66 PR Interval:  146 QRS Duration:  99 QT Interval:  400 QTC Calculation: 420 R Axis:   33  Text Interpretation: Sinus rhythm no acute ST/T changes similar to July 2024 Confirmed by Pricilla Loveless [phone]) on 02/07/2024 1:07:54 PM  Radiology US Venous Img Upper Uni Left Result Date: 02/07/2024 CLINICAL DATA:  Left upper extremity pain. EXAM: LEFT UPPER EXTREMITY VENOUS DOPPLER ULTRASOUND TECHNIQUE: Gray-scale sonography with graded compression, as well as  color Doppler and duplex ultrasound were performed to evaluate the upper extremity deep venous system from the level of the subclavian vein and including the jugular, axillary, basilic, radial, ulnar and upper cephalic vein. Spectral Doppler was utilized to evaluate flow at rest and with distal augmentation maneuvers. COMPARISON:  None Available. FINDINGS: Contralateral Subclavian Vein: Respiratory phasicity is normal and symmetric with the symptomatic side. No evidence of thrombus. Normal compressibility. Internal Jugular Vein: No evidence of thrombus. Normal compressibility, respiratory phasicity and response to augmentation. Subclavian Vein: No evidence of thrombus. Normal compressibility, respiratory phasicity and response to augmentation. Axillary Vein: No evidence of thrombus. Normal compressibility, respiratory phasicity and response to augmentation. Cephalic Vein: No evidence of thrombus. Normal compressibility, respiratory phasicity and response to augmentation. Basilic Vein: No evidence of thrombus. Normal compressibility, respiratory phasicity and response to augmentation. Brachial Veins: No evidence of thrombus. Normal compressibility, respiratory phasicity and response to augmentation. Radial Veins: No evidence of thrombus. Normal compressibility, respiratory phasicity and response to augmentation. Ulnar Veins: No evidence of thrombus. Normal compressibility, respiratory phasicity and response to augmentation. Venous Reflux:  None visualized. Other Findings: No evidence of superficial thrombophlebitis or abnormal fluid collection. IMPRESSION: No evidence of DVT within the left upper extremity. Electronically Signed   By: Irish Lack M.D.   On: 02/07/2024 12:50   DG Chest 2 View Result Date: 02/07/2024 CLINICAL DATA:  Chest pain EXAM: CHEST - 2 VIEW COMPARISON:  06/20/2023 x-ray and CTA FINDINGS: No consolidation, pneumothorax or effusion. No edema. Normal cardiopericardial silhouette. Slight  peribronchial thickening. IMPRESSION: Slight peribronchial thickening. Electronically Signed   By: Karen Kays M.D.   On: 02/07/2024 11:57    Procedures Procedures    Medications Ordered in ED Medications  ibuprofen (ADVIL) tablet 600 mg (600 mg Oral Given 02/07/24 1153)    ED Course/ Medical Decision Making/ A&P                                 Medical Decision Making Amount and/or Complexity of Data Reviewed Labs: ordered.    Details: Normal troponin x 2 Radiology: ordered and independent interpretation performed.    Details: No CHF or pneumonia.  No DVT ECG/medicine tests: ordered and independent interpretation performed.    Details: No ischemia   Patient's chest pain is quite atypical.  While he does have aortic and coronary atherosclerosis on CT dedicated to his lungs, this is pretty atypical for both an aortic emergency as well as ACS.  Doubt PE, dissection, ACS.  Troponins are negative.  Probably more muscular and related to his left arm pain.  DVT ultrasound is negative.  Has a strong pulse in the arm.  No neurosymptoms.  I think he is stable for discharge home and can use NSAIDs and follow-up with the PCP.  Given return precautions.        Final Clinical Impression(s) / ED Diagnoses Final diagnoses:  Nonspecific chest pain    Rx / DC Orders ED Discharge Orders     None         Pricilla Loveless, MD 02/07/24 1418

## 2024-02-07 NOTE — Discharge Instructions (Signed)
 If you develop recurrent, continued, or worsening chest pain, shortness of breath, fever, vomiting, abdominal or back pain, or any other new/concerning symptoms then return to the ER for evaluation.

## 2024-02-07 NOTE — ED Triage Notes (Addendum)
 C/o left sided chest pain radiating to left arm since Sunday night intermittently. Also c/o generalized fatigue and headache.

## 2024-02-07 NOTE — ED Notes (Signed)
 Patient refused covid/flu swab

## 2024-02-07 NOTE — ED Notes (Signed)
Report rec'd from prev RN
